# Patient Record
Sex: Female | Born: 1970 | Race: Black or African American | Hispanic: No | Smoking: Never smoker
Health system: Southern US, Community
[De-identification: ages and names within clinical notes are randomized; demographics above are authoritative.]

## PROBLEM LIST (undated history)

## (undated) DIAGNOSIS — M1990 Unspecified osteoarthritis, unspecified site: Secondary | ICD-10-CM

## (undated) HISTORY — DX: Unspecified osteoarthritis, unspecified site: M19.90

---

## 1999-09-28 ENCOUNTER — Ambulatory Visit (HOSPITAL_COMMUNITY): Admission: RE | Admit: 1999-09-28 | Discharge: 1999-09-28 | Payer: Self-pay | Admitting: Family Medicine

## 1999-09-28 ENCOUNTER — Encounter: Payer: Self-pay | Admitting: Family Medicine

## 2001-06-23 ENCOUNTER — Encounter: Admission: RE | Admit: 2001-06-23 | Discharge: 2001-06-23 | Payer: Self-pay | Admitting: General Practice

## 2001-06-23 ENCOUNTER — Encounter: Payer: Self-pay | Admitting: General Practice

## 2001-08-06 ENCOUNTER — Encounter: Admission: RE | Admit: 2001-08-06 | Discharge: 2001-08-06 | Payer: Self-pay | Admitting: General Practice

## 2001-08-06 ENCOUNTER — Encounter: Payer: Self-pay | Admitting: General Practice

## 2001-11-26 ENCOUNTER — Encounter: Payer: Self-pay | Admitting: Family Medicine

## 2001-11-26 ENCOUNTER — Ambulatory Visit (HOSPITAL_COMMUNITY): Admission: RE | Admit: 2001-11-26 | Discharge: 2001-11-26 | Payer: Self-pay | Admitting: Family Medicine

## 2002-01-22 ENCOUNTER — Other Ambulatory Visit: Admission: RE | Admit: 2002-01-22 | Discharge: 2002-01-22 | Payer: Self-pay | Admitting: Obstetrics and Gynecology

## 2002-02-25 ENCOUNTER — Other Ambulatory Visit: Admission: RE | Admit: 2002-02-25 | Discharge: 2002-02-25 | Payer: Self-pay | Admitting: Obstetrics and Gynecology

## 2002-08-07 ENCOUNTER — Emergency Department (HOSPITAL_COMMUNITY): Admission: EM | Admit: 2002-08-07 | Discharge: 2002-08-07 | Payer: Self-pay | Admitting: Emergency Medicine

## 2002-08-07 ENCOUNTER — Encounter: Payer: Self-pay | Admitting: Emergency Medicine

## 2002-09-05 ENCOUNTER — Inpatient Hospital Stay (HOSPITAL_COMMUNITY): Admission: AD | Admit: 2002-09-05 | Discharge: 2002-09-05 | Payer: Self-pay | Admitting: Obstetrics and Gynecology

## 2002-09-08 ENCOUNTER — Inpatient Hospital Stay (HOSPITAL_COMMUNITY): Admission: AD | Admit: 2002-09-08 | Discharge: 2002-09-08 | Payer: Self-pay | Admitting: Family Medicine

## 2007-01-29 ENCOUNTER — Ambulatory Visit (HOSPITAL_COMMUNITY): Admission: RE | Admit: 2007-01-29 | Discharge: 2007-01-29 | Payer: Self-pay | Admitting: Family Medicine

## 2009-06-29 ENCOUNTER — Ambulatory Visit (HOSPITAL_COMMUNITY): Admission: RE | Admit: 2009-06-29 | Discharge: 2009-06-29 | Payer: Self-pay | Admitting: Gastroenterology

## 2011-04-16 IMAGING — CT CT ABD-PELV W/ CM
2 of 4 series · 17 of 46 positions shown, 19 images · IV contrast (agent unspecified)
Comparison: None.

CLINICAL DATA: Left lower quadrant pain.  Mass felt on exam.

CT ABDOMEN AND PELVIS WITH CONTRAST
TECHNIQUE: Multidetector CT imaging of the abdomen and pelvis was
performed following the standard protocol during bolus
administration of intravenous contrast.
Contrast: 100 ml [id number]-H22.

[Series 2: rtn a/p with · axial · 0.62mm/px · z∈[-442,-82]mm · 14 of 81 slices shown, 16 images]
[im 6/81  soft-tissue]
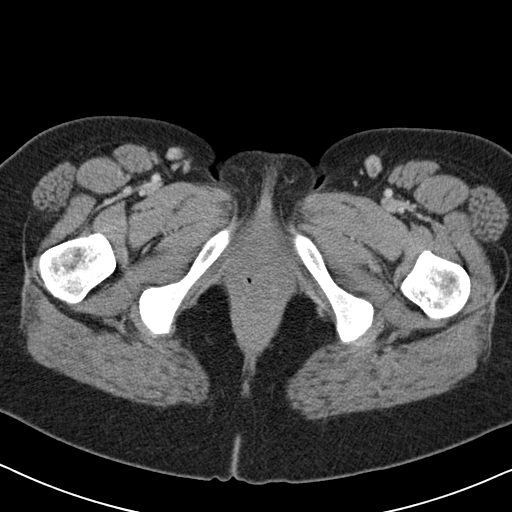
[im 6/81  bone]
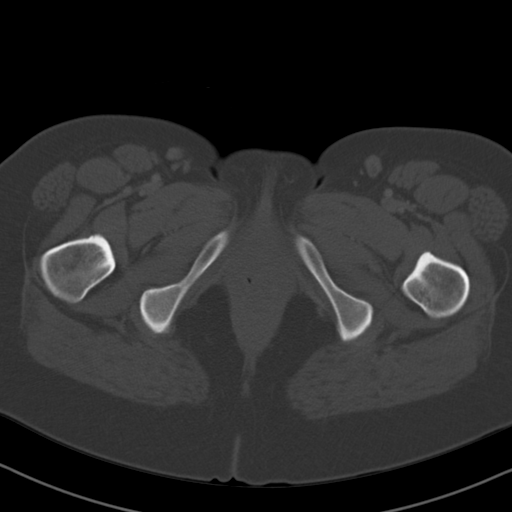
[im 12/81  soft-tissue]
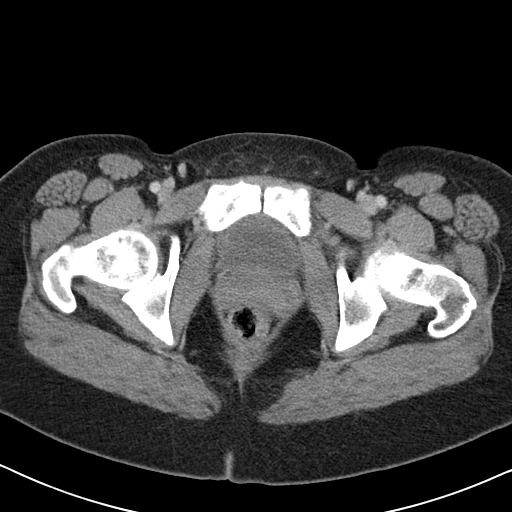
[im 17/81  soft-tissue]
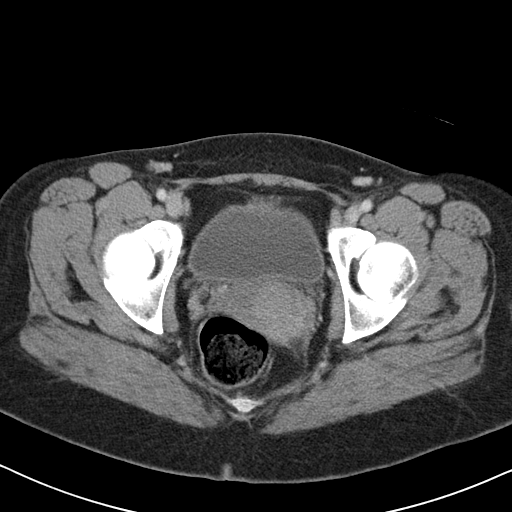
[im 23/81  soft-tissue]
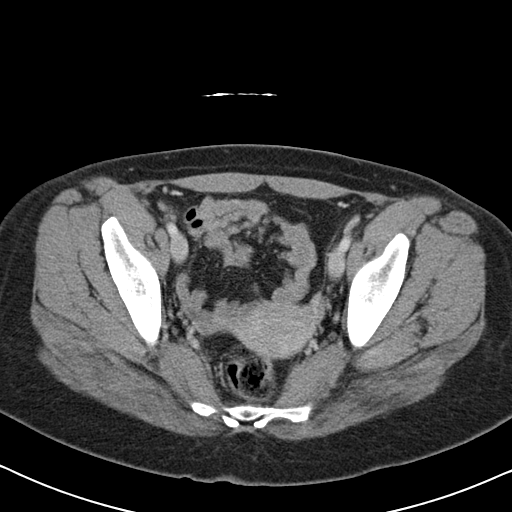
[im 28/81  soft-tissue]
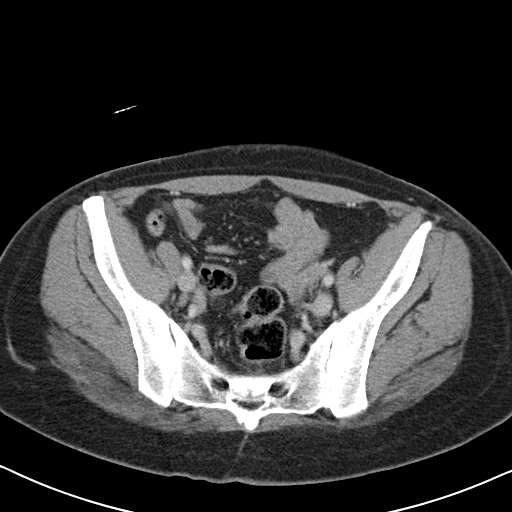
[im 34/81  soft-tissue]
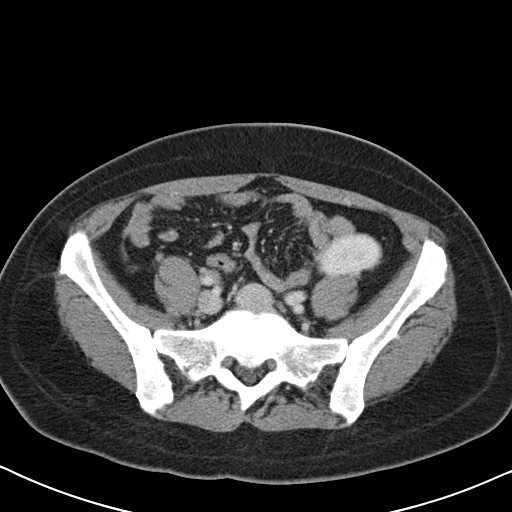
[im 39/81  soft-tissue]
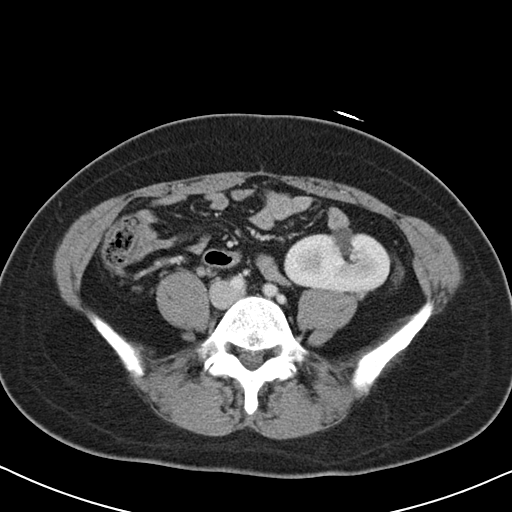
[im 45/81  soft-tissue]
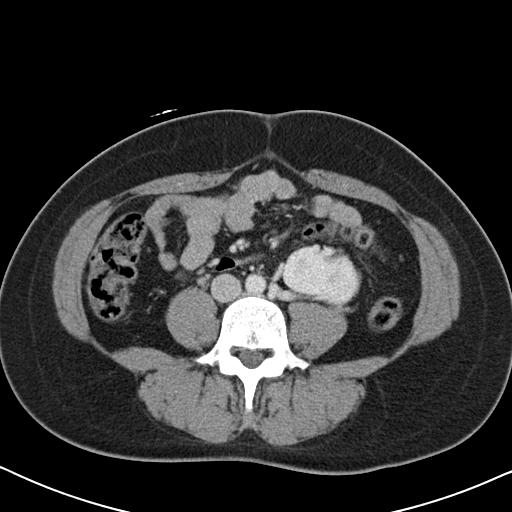
[im 50/81  soft-tissue]
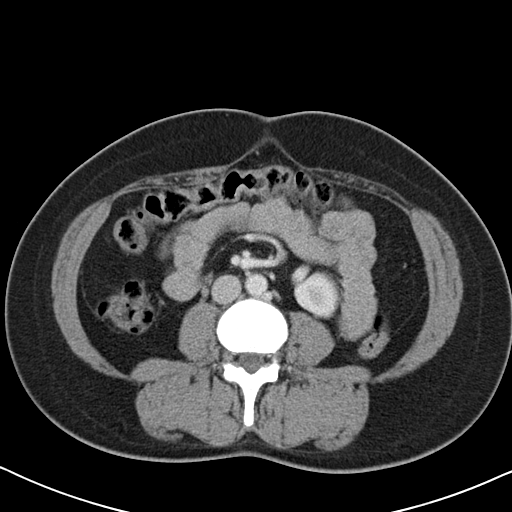
[im 50/81  bone]
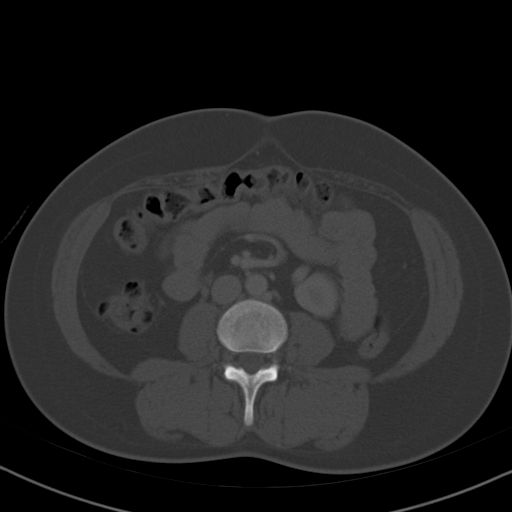
[im 56/81  soft-tissue]
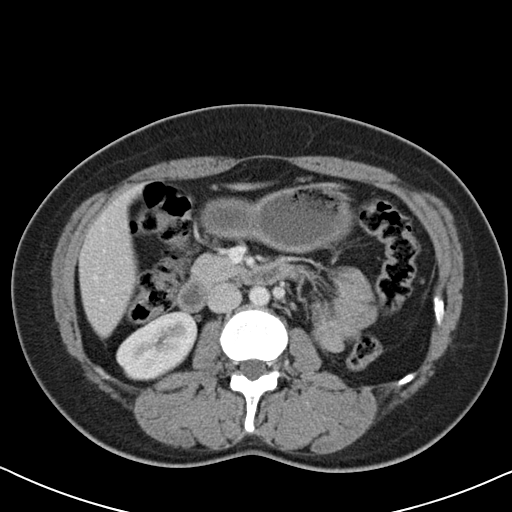
[im 61/81  soft-tissue]
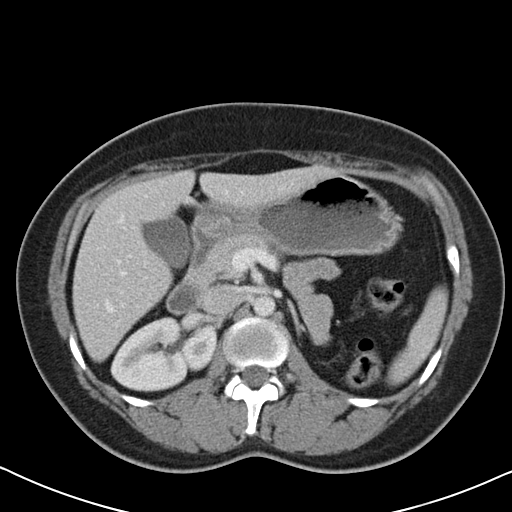
[im 67/81  soft-tissue]
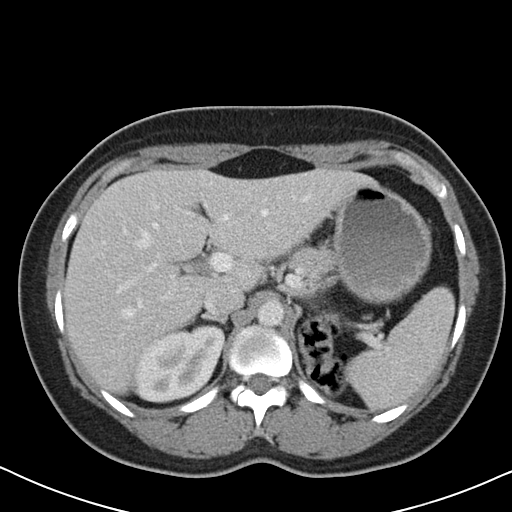
[im 72/81  soft-tissue]
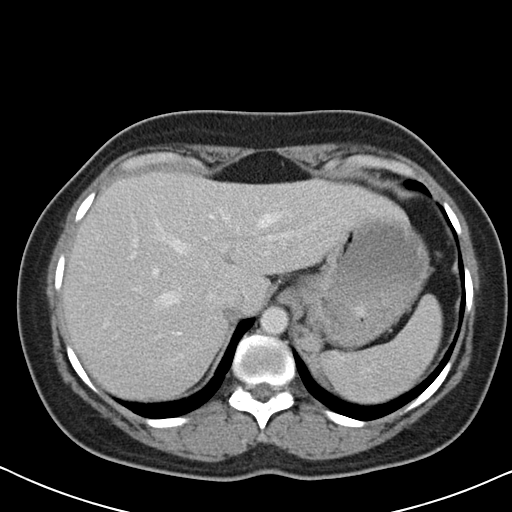
[im 78/81  soft-tissue]
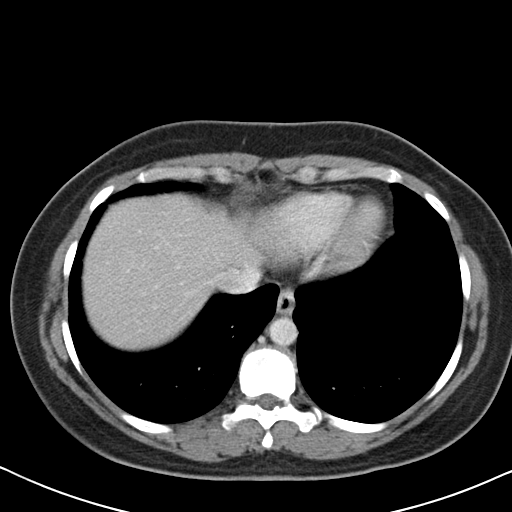

[Series 602: <mpr thick range> · coronal · 0.79mm/px · 3 of 71 slices shown]
[im 24/71  soft-tissue]
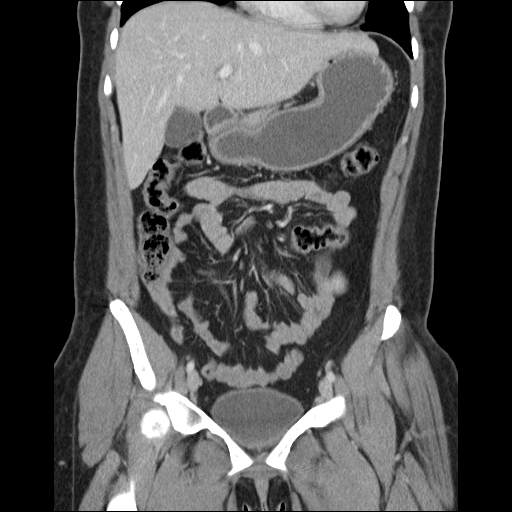
[im 32/71  soft-tissue]
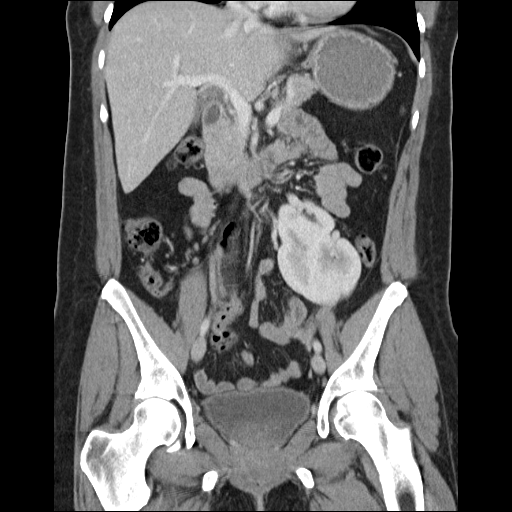
[im 39/71  soft-tissue]
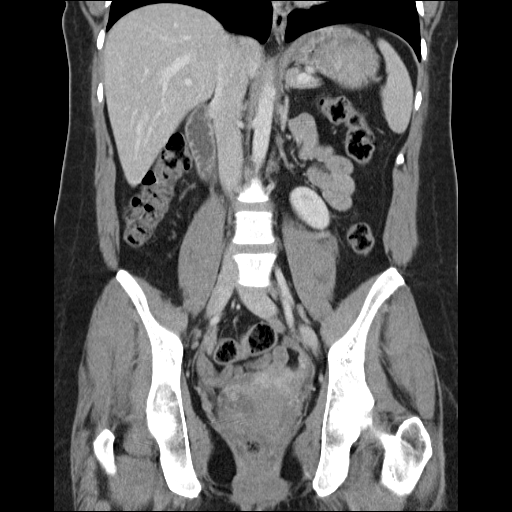

[17 of 46 positions shown; findings below may reference images not displayed]

FINDINGS: The left kidney is ptotic and displaced into the inferior
left abdomen.  It is possible that this is what was felt as a left-
sided mass.  No mass separate from this area is identified.  No
inflammatory process.

6.4 mm low density structure within the right kidney may be a cyst
although too small to adequately characterize otherwise no renal,
adrenal, hepatic, splenic or pancreatic mass noted.  No bony
destructive lesion.  No calcified gallstones.  No abdominal aortic
aneurysm.  Scattered normal sized lymph nodes without adenopathy.
IMPRESSION: Ptotic left kidney may represent left-sided palpable mass mass.

6.4 mm low density lesion within the right kidney too small to
adequately characterize possibly representing a small cyst.

## 2014-12-15 ENCOUNTER — Emergency Department (HOSPITAL_BASED_OUTPATIENT_CLINIC_OR_DEPARTMENT_OTHER)
Admission: EM | Admit: 2014-12-15 | Discharge: 2014-12-15 | Disposition: A | Discharge status: Home / Self Care | Payer: 59 | Attending: Emergency Medicine | Admitting: Emergency Medicine

## 2014-12-15 ENCOUNTER — Encounter (HOSPITAL_BASED_OUTPATIENT_CLINIC_OR_DEPARTMENT_OTHER): Payer: Self-pay | Admitting: *Deleted

## 2014-12-15 DIAGNOSIS — M546 Pain in thoracic spine: Secondary | ICD-10-CM | POA: Diagnosis present

## 2014-12-15 MED ORDER — IBUPROFEN 400 MG PO TABS: 400.0000 mg | ORAL_TABLET | Freq: Four times a day (QID) | ORAL | Status: AC

## 2014-12-15 MED ORDER — METHOCARBAMOL 500 MG PO TABS: 500.0000 mg | ORAL_TABLET | Freq: Two times a day (BID) | ORAL | Status: DC

## 2014-12-15 MED ORDER — KETOROLAC TROMETHAMINE 60 MG/2ML IM SOLN
60.0000 mg | Freq: Once | INTRAMUSCULAR | Status: AC
  Administered 2014-12-15: 60 mg via INTRAMUSCULAR
  Filled 2014-12-15: qty 2

## 2014-12-15 MED ORDER — DIAZEPAM 5 MG PO TABS
5.0000 mg | ORAL_TABLET | Freq: Once | ORAL | Status: AC
  Administered 2014-12-15: 5 mg via ORAL
  Filled 2014-12-15: qty 1

## 2014-12-15 NOTE — ED Provider Notes (Signed)
CSN: [id number]     Arrival date & time 12/15/14  4098 History   First MD Initiated Contact with Patient 12/15/14 [phone]     Chief Complaint  Patient presents with  . Back Pain     (Consider location/radiation/quality/duration/timing/severity/associated sxs/prior Treatment) Patient is a 44 y.o. female presenting with back pain.  Back Pain Location:  Thoracic spine Quality:  Aching and stiffness Radiates to:  Does not radiate Pain severity:  Mild Onset quality:  Gradual Duration:  5 days Timing:  Constant Chronicity:  New Context: lifting heavy objects   Ineffective treatments: chiropractor. Associated symptoms: no abdominal pain, no dysuria, no fever, no headaches, no leg pain, no numbness and no weakness     History reviewed. No pertinent past medical history. History reviewed. No pertinent past surgical history. No family history on file. Social History  Substance Use Topics  . Smoking status: Never Smoker   . Smokeless tobacco: None  . Alcohol Use: No   OB History    No data available     Review of Systems  Constitutional: Negative for fever.  HENT: Negative for ear pain.   Respiratory: Negative for cough and shortness of breath.   Gastrointestinal: Negative for abdominal pain.  Genitourinary: Negative for dysuria.  Musculoskeletal: Positive for back pain.  Neurological: Negative for weakness, light-headedness, numbness and headaches.  All other systems reviewed and are negative.     Allergies  Review of patient's allergies indicates no known allergies.  Home Medications   Prior to Admission medications   Medication Sig Start Date End Date Taking? Authorizing Herlinda Heady  ibuprofen (ADVIL,MOTRIN) 400 MG tablet Take 1 tablet (400 mg total) by mouth every 6 (six) hours. 12/15/14 12/19/14  Marily Memos, MD  methocarbamol (ROBAXIN) 500 MG tablet Take 1 tablet (500 mg total) by mouth 2 (two) times daily. 12/15/14   Barbara Cower Mesner, MD   BP 145/77 mmHg  Pulse 74   Temp(Src) 98.3 F (36.8 C) (Oral)  Resp 16  Ht  (1.702 m)  Wt 170 lb (77.111 kg)  BMI 26.62 kg/m2  SpO2 100% Physical Exam  Constitutional: She is oriented to person, place, and time. She appears well-developed and well-nourished.  HENT:  Head: Normocephalic and atraumatic.  Eyes: Conjunctivae and EOM are normal. Right eye exhibits no discharge. Left eye exhibits no discharge.  Cardiovascular: Normal rate and regular rhythm.   Pulmonary/Chest: Effort normal and breath sounds normal. No respiratory distress.  Abdominal: Soft. She exhibits no distension. There is no tenderness. There is no rebound.  Musculoskeletal: Normal range of motion. She exhibits tenderness (paraspinal muscles around thoracic spine, worse with flexion of muscles). She exhibits no edema.  Neurological: She is alert and oriented to person, place, and time.  Skin: Skin is warm and dry.  Nursing note and vitals reviewed.   ED Course  Procedures (including critical care time) Labs Review Labs Reviewed - No data to display  Imaging Review No results found. I have personally reviewed and evaluated these images and lab results as part of my medical decision-making.   EKG Interpretation None      MDM   Final diagnoses:  Bilateral thoracic back pain   Back strain, feels similar to prior. Will tx symptomatically. Recent travel to [place] but No shortness of breath or cough to suggest pulmonary causes. Doubt cardiac related. No fevers or systemic symptoms suggest infectious causes. DC home with symptomatically treatment have her follow-up with the primary care doctor. Return here for new or worsening  symptoms.  I have personally and contemperaneously reviewed labs and imaging and used in my decision making as above.   A medical screening exam was performed and I feel the patient has had an appropriate workup for their chief complaint at this time and likelihood of emergent condition existing is low. They have  been counseled on decision, discharge, follow up and which symptoms necessitate immediate return to the emergency department. They or their family verbally stated understanding and agreement with plan and discharged in stable condition.      Marily Memos, MD 12/15/14 [phone]

## 2014-12-15 NOTE — ED Notes (Signed)
Pt c/o middle back pain x 2 weeks that has gotten progressivly worse. Pt sts the pain gets worse at night when she is lying down. Pt sts she does heavy lifting on a regular basis.

## 2014-12-15 NOTE — Discharge Instructions (Signed)
°Emergency Department Resource Guide °1) Find a Doctor and Pay Out of Pocket °Although you won't have to find out who is covered by your insurance plan, it is a good idea to ask around and get recommendations. You will then need to call the office and see if the doctor you have chosen will accept you as a new patient and what types of options they offer for patients who are self-pay. Some doctors offer discounts or will set up payment plans for their patients who do not have insurance, but you will need to ask so you aren't surprised when you get to your appointment. ° °2) Contact Your Local Health Department °Not all health departments have doctors that can see patients for sick visits, but many do, so it is worth a call to see if yours does. If you don't know where your local health department is, you can check in your phone book. The CDC also has a tool to help you locate your state's health department, and many state websites also have listings of all of their local health departments. ° °3) Find a Walk-in Clinic °If your illness is not likely to be very severe or complicated, you may want to try a walk in clinic. These are popping up all over the country in pharmacies, drugstores, and shopping centers. They're usually staffed by nurse practitioners or physician assistants that have been trained to treat common illnesses and complaints. They're usually fairly quick and inexpensive. However, if you have serious medical issues or chronic medical problems, these are probably not your best option. ° °No Primary Care Doctor: °- Call [facility] at  832-8000 - they can help you locate a primary care doctor that  accepts your insurance, provides certain services, etc. °- Physician Referral Service- 1-800-533-3463 ° °Chronic Pain Problems: °Organization         Address  Phone   Notes  °[facility]  (336) 297-2271 Patients need to be referred by their primary care doctor.  ° °Medication  Assistance: °Organization         Address  Phone   Notes  °[place] [facility] [address] °[place], [place] [address] (336) 641-8030 --Must be a resident of [place] °-- Must have NO insurance coverage whatsoever (no Medicaid/ Medicare, etc.) °-- The pt. MUST have a primary care doctor that directs their care regularly and follows them in the community °  °[facility]  (866) 331-1348   °[employer]  (888) 892-1162   ° °Agencies that provide inexpensive medical care: °Organization         Address  Phone   Notes  °[facility]  (336) 832-8035   °[facility]    (336) 832-7272   °[facility] [address] °[place], [place] [address] (336) 832-4777   °[facility] [address], °[place] (336) 271-4999   °[facility]    (336) 373-0678   °[facility]    (336) 272-1050   °[facility] ° [address], [place] Phone:  (336) 832-4444, Fax:  (336) 832-4440 Hours of Operation:  9 am - 6 pm, M-F.  Also accepts Medicaid/Medicare and self-pay.  °[facility] ° [address], [place] Phone: (336) 832-3150, Fax: (336) [phone]. Hours of Operation:  8:30 am - 5:30 pm, M-F.  Also accepts Medicaid and self-pay.  °[facility] [place] [address], [place] Phone: (336) 878-6027   °[facility] [address], [place], [place] (336)723-1848, Ext. 123 Mondays & Thursdays: 7-9 AM.  First 15 patients are seen on a first come, first serve basis. °  ° °Medicaid-accepting [place] Providers: ° °Organization         Address  Phone   Notes  °[facility] [address], [place] (336) 641-2100 Also accepts self-pay patients.  °[facility] [address], [place] ° (336) 856-9996   °[facility] [address], [place]  (336) 288-8857   °[facility] [address] [place] [address], [place] (336) 299-7000   °Veita Bland [address], [place]  ° (336) 373-1557 Only accepts [place] Access Medicaid patients after they have their name applied to their card.  ° °Self-Pay (no insurance) in [place]: ° °Organization         Address  Phone   Notes  °Sickle Cell Patients, [facility] [address], [place] (336) 832-1970   °[facility] [address], [place] (336) 832-4400   °[facility] ° [address], [place] (336) 992-4800   °[facility]/Dr. Osei-Bonsu ° [address] [place] [address], [place] or [address], [place] (336) 841-8500 Phone number for both [place] and [place] locations is the same.  °[facility] [address], [place] (336) 299-0000   °[facility] [address] [place] [address], [place] or [address] (336) 852-7530 °(336) 878-2260   °[facility] [address], [place] (336) 350-1642, phone; (336) 294-5005, fax Sees patients 1st and 3rd Saturday of every month.  Must not qualify for public or private insurance (i.e. Medicaid, Medicare, [place] Health Choice, Veterans' Benefits) • Household income should be no more than 200% of the poverty level •The clinic cannot treat you if you are pregnant or think you are pregnant • Sexually transmitted diseases are not treated at the clinic.  ° ° °Dental Care: °Organization         Address  Phone  Notes  °[place] [facility] [address], [place] (336) 641-6152 Accepts children up to age 21 who are enrolled in Medicaid or [place] Health Choice; pregnant women with a Medicaid card; and children who have applied for Medicaid or [place] Health Choice, but were declined, whose parents can pay a reduced fee at time of service.  °[place]  [facility] [place]  [address], [place] (336) 641-7733 Accepts children up to age 21 who are enrolled in Medicaid or [place] Health Choice; pregnant women with a Medicaid card; and children who have applied for Medicaid or  Health Choice, but were declined, whose parents can pay a reduced fee at time of service.  °[facility] ° [address],  (336) 641-4533 Patients are seen by appointment only. Walk-ins are not accepted. [facility] will see patients 18 years of age and older. °Monday - Tuesday (8am-5pm) °Most Wednesdays (8:30-5pm) °$30 per visit, cash only  °[facility] ° [address], [place] (336) 641-4533 Patients are seen by appointment only. Walk-ins are not accepted. [facility] will see patients 18 years of age and older. °One   Wednesday Evening (Monthly: Volunteer Based).  $30 per visit, cash only  °[facility]  (919) 537-3737 for adults; Children under age 4, call Graduate Pediatric Dentistry at (919) [phone]. Children aged 4-14, please call (919) [phone] to request a pediatric application. ° Dental services are provided in all areas of dental care including fillings, crowns and bridges, complete and partial dentures, implants, gum treatment, root canals, and extractions. Preventive care is also provided. Treatment is provided to both adults and children. °Patients are selected via a lottery and there is often a waiting list. °  °[facility] [address], °[place] ° (336) 763-8833 [email or web address] °  °[facility] [address], [place], [place] (336)723-1848, Ext. 123 Second and Fourth Thursday of each month, opens at 6:30 AM; Clinic ends at 9 AM.  Patients are seen on a first-come first-served basis, and a limited number are seen during each clinic.  ° °[facility] ° [address], [place], [place] (336) 723-7904    Eligibility Requirements °You must have lived in [place], [place], or [place] counties for at least the last three months. °  You cannot be eligible for state or federal sponsored healthcare insurance, including Veterans Administration, Medicaid, or Medicare. °  You generally cannot be eligible for healthcare insurance through your employer.  °  How to apply: °Eligibility screenings are held every Tuesday and Wednesday afternoon from 1:00 pm until 4:00 pm. You do not need an appointment for the interview!  °[facility] [address], [place], [place] 336-631-2330   °[facility]  336-342-8273   °[facility]  336-703-3100   °[facility]  336-570-6415   ° °Behavioral Health Resources in the Community: °Intensive Outpatient Programs °Organization         Address  Phone  Notes  °[facility] [address], [place], [place] 336-878-6098   °[facility] [address], [place], [place] 336-832-9800   °[facility] [address], [place], [place] ° 336-882-2125   °[facility] [address],  °[place], [place] 1-800-853-5163 or 336-641-4981   °Substance Abuse Resources °Organization         Address  Phone  Notes  °[facility]  336-882-2125   °[facility]  336-784-9470   °[facility]  336-285-9073   °[facility]  336-845-3988   °Residential & Outpatient Substance Abuse Program  1-800-659-3381   °Psychological Services °Organization         Address  Phone  Notes  °[facility]  336- 832-9600   °[facility]  336- 378-7881   °[facility] [address], [place] 1-800-853-5163 or 336-641-4981   ° °Mobile Crisis Teams °Organization         Address  Phone  Notes  °[facility], Mobile Crisis Care Unit  1-877-626-1772   °Assertive °[facility] ° [address]  [place], [place] 336-834-9664   °Sharon DeEsch [address] °[place] 336-554-5454   ° °Self-Help/Support Groups °Organization         Address  Phone             Notes  °[facility] - variety of support groups  336- 373-1402 Call for more information  °Narcotics Anonymous (NA), [facility] [address], °[place]   2 meetings at this location  ° °  Residential Treatment Programs °Organization         Address  Phone  Notes  °[facility] [address],    °[place]  1-866-801-8205   °[facility] ° [address], [place], [place] 704-293-8524   °[facility] [address], [place] 336-845-3988 Admissions: 8am-3pm M-F  °[facility] [address]    °[place], [place] 336-841-1104   °[facility] [address], [place], [place] 336-379-7146   °[facility] [address]  °[place], [place] 336-285-9073   °[facility] - Intensive Outpatient [address], [place], [place] 336-852-3033   °[facility] [address]  °[place], [place] 1-877-615-2722 or 336-784-9470   °[facility] [address] [place], [place] 336-227-7417 Accepts Medicaid  °[facility] [address]  °[place] 1-800-659-3381 Substance Abuse/Addiction Treatment  ° °[place] Behavioral Health Resources °Organization         Address  Phone  Notes  °[facility]  (888) 581-9988   °Julie Brannon, PhD [address] [place], [place]   (336) 349-5553 or (336) 951-0000   °[facility]   [address] °[place], [place] (336) 349-4454   °[facility] [address], [place], [place] (336) 342-8316 Insurance/Medicaid/sponsorship through [facility]  °[facility] [address]                                    [place], [place] (336) 342-8316 Therapy/tele-psych/case    °[facility] [address]  ° [place], [place] (336) 349-2233    °Dr. Arfeen  (336) 349-4544   °[facility] [place]  [employer] [place] [facility] 1) [address], [place] °2) [address], [place] °3)  [address], [place] (336) 349-3220 °(336) 342-7768 ° °(336) 342-8140   °[place] [facility] (336) 342-1394 or (336) 342-3537 (After Hours)    ° ° °

## 2014-12-15 NOTE — ED Notes (Signed)
Pt states that her sister is on the way to pick her up. Will notify this rn when her sister arrives.

## 2015-12-06 ENCOUNTER — Ambulatory Visit (INDEPENDENT_AMBULATORY_CARE_PROVIDER_SITE_OTHER): Payer: Self-pay | Admitting: Physician Assistant

## 2015-12-06 VITALS — BP 138/76 | HR 59 | Temp 98.2°F | Resp 16 | Ht 67.0 in | Wt 181.0 lb

## 2015-12-06 DIAGNOSIS — R9431 Abnormal electrocardiogram [ECG] [EKG]: Secondary | ICD-10-CM

## 2015-12-06 DIAGNOSIS — E041 Nontoxic single thyroid nodule: Secondary | ICD-10-CM

## 2015-12-06 DIAGNOSIS — R0789 Other chest pain: Secondary | ICD-10-CM

## 2015-12-06 MED ORDER — RANITIDINE HCL 150 MG PO TABS: 150.0000 mg | ORAL_TABLET | Freq: Two times a day (BID) | ORAL | 1 refills | Status: DC

## 2015-12-06 NOTE — Progress Notes (Signed)
Kathy RoupKhadidiatou Giuliano  MRN: [id number] DOB: 03/21/1971  Subjective:  Kathy Porter is a 45 y.o. female seen in office today for a chief complaint of chest discomfort and lump in throat.   1)Chest discomfort: Pt complains of intermittent burning in anterior chest and back x 4 days. She has associated acidic taste in mouth, acidic regurgitation, and belching, more commonly at night time. She denies SOB, nausea, vomiting, abdominal pain, diaphoresis, radiating symptoms, and exertional chest pain. Chest discomfort is not present today. She loves to eat spicy foods like hot peppers and vinegar and notes that these foods tend to make the chest pain worse. She eats at 9pm on most nights and lies down for bed around 10 pm. The pain typically presents after she has been lying down for a little bit. When the pain appears, she drinks milk with minimal relief.  2) Throat lump: Pt noticed a lump on her neck about 2 months incidentally. Since then, she has constantly been palpating it to make sure it has not changed in size. She denies pain, change in size, and difficulty swallowing. Has not tried anything to make it better.   Review of Systems  Constitutional: Negative for appetite change, chills, fatigue, fever and unexpected weight change.  HENT: Negative for voice change.   Respiratory: Negative for cough and shortness of breath.   Cardiovascular: Negative for palpitations and leg swelling.  Gastrointestinal: Negative for abdominal pain, constipation, diarrhea, nausea and vomiting.  Endocrine: Negative for cold intolerance and heat intolerance.  Musculoskeletal: Negative for myalgias.  Neurological: Negative for dizziness, tremors, weakness, light-headedness, numbness and headaches.    There are no active problems to display for this patient.   Current Outpatient Prescriptions on File Prior to Visit  Medication Sig Dispense Refill  . methocarbamol (ROBAXIN) 500 MG tablet Take 1 tablet (500 mg  total) by mouth 2 (two) times daily. 20 tablet 0   No current facility-administered medications on file prior to visit.    History reviewed. No pertinent past medical history.   No family history of heart disease or thyroid disease.  No Known Allergies   Social History   Social History  . Marital status: Single    Spouse name: N/A  . Number of children: N/A  . Years of education: N/A   Occupational History  . Not on file.   Social History Main Topics  . Smoking status: Never Smoker  . Smokeless tobacco: Never Used  . Alcohol use No  . Drug use: No  . Sexual activity: Not on file   Other Topics Concern  . Not on file   Social History Narrative  . No narrative on file    Objective:  BP 138/76   Pulse (!) 59   Temp 98.2 F (36.8 C) (Oral)   Resp 16   Ht 5\' 7"  (1.702 m)   Wt 181 lb (82.1 kg)   LMP  (LMP Unknown)   SpO2 99%   BMI 28.35 kg/m   Physical Exam  Constitutional: She is oriented to person, place, and time and well-developed, well-nourished, and in no distress.  HENT:  Head: Normocephalic and atraumatic.  Nose: Nose normal.  Eyes: Conjunctivae are normal.  Neck: Normal range of motion. Thyroid mass (palpable 1cm nontender, mobile mass on left thyroid, best palpated when pt turns her head to look over right shoulder ) present.  Cardiovascular: Normal rate, regular rhythm and normal heart sounds.   Pulmonary/Chest: Effort normal.  Musculoskeletal:  Right lower leg: She exhibits no swelling.       Left lower leg: She exhibits no swelling.  Lymphadenopathy:       Head (right side): No submental, no submandibular, no tonsillar, no preauricular, no posterior auricular and no occipital adenopathy present.       Head (left side): No submental, no submandibular, no tonsillar, no preauricular, no posterior auricular and no occipital adenopathy present.    She has no cervical adenopathy.       Right: No supraclavicular adenopathy present.       Left: No  supraclavicular adenopathy present.  Neurological: She is alert and oriented to person, place, and time. Gait normal.  Reflex Scores:      Tricep reflexes are 2+ on the right side and 2+ on the left side.      Bicep reflexes are 2+ on the right side and 2+ on the left side.      Brachioradialis reflexes are 2+ on the right side and 2+ on the left side.      Patellar reflexes are 2+ on the right side and 2+ on the left side.      Achilles reflexes are 2+ on the right side and 2+ on the left side. Skin: Skin is warm and dry.  Psychiatric: Affect normal.  Vitals reviewed.   No results found for this or any previous visit (from the past 24 hour(s)).  EKG shows nonspecific ST changes and T wave inversion. No prior EKG to compare to. EKG presented to Dr. Nilda Simmer, who agrees a cardiology consult is warranted.   Assessment and Plan :  1. Chest discomfort -History consistent with GERD; discussed lifestyle and diet modifications, given educational material on GERD - EKG 12-Lead - ranitidine (ZANTAC) 150 MG tablet; Take 1 tablet (150 mg total) by mouth 2 (two) times daily.  Dispense: 60 tablet; Refill: 1 -Follow up in four weeks if no improvement -Seek care immediately if develop new concerning symptoms disccused or current symptoms worsen  2. Abnormal finding on EKG -Pt has been told by physicians in the past that she has an abnormal EKG; however, she cannot recall what they said was abnormal about it and she does not have any of her previous records. Due to abnormal EKG findings, will refer to cardiology. - Ambulatory referral to Cardiology  3. Thyroid nodule -Await results - US THYROID; Future - TSH  Benjiman Core PA-C  [facility] 12/06/2015 10:12 AM

## 2015-12-06 NOTE — Patient Instructions (Addendum)
Take zantac twice a day for heart burn.  Try lifestyle and diet modifications we discussed.  If you do not have any improvement in four weeks, follow up.  Follow up with thyroid US Follow up with cardiology  Come back in a couple of weeks for annual physical exam.     IF you received an x-ray today, you will receive an invoice from [facility]. Please contact [facility] at [phone] with questions or concerns regarding your invoice.   IF you received labwork today, you will receive an invoice from [facility]. Please contact [facility] at [phone] with questions or concerns regarding your invoice.   Our billing staff will not be able to assist you with questions regarding bills from these companies.  You will be contacted with the lab results as soon as they are available. The fastest way to get your results is to activate your My Chart account. Instructions are located on the last page of this paperwork. If you have not heard from us regarding the results in 2 weeks, please contact this office.     Food Choices for Gastroesophageal Reflux Disease, Adult When you have gastroesophageal reflux disease (GERD), the foods you eat and your eating habits are very important. Choosing the right foods can help ease the discomfort of GERD. WHAT GENERAL GUIDELINES DO I NEED TO FOLLOW?  Choose fruits, vegetables, whole grains, low-fat dairy products, and low-fat meat, fish, and poultry.  Limit fats such as oils, salad dressings, butter, nuts, and avocado.  Keep a food diary to identify foods that cause symptoms.  Avoid foods that cause reflux. These may be different for different people.  Eat frequent small meals instead of three large meals each day.  Eat your meals slowly, in a relaxed setting.  Limit fried foods.  Cook foods using methods other than frying.  Avoid drinking alcohol.  Avoid drinking large amounts of liquids with your  meals.  Avoid bending over or lying down until 2-3 hours after eating. WHAT FOODS ARE NOT RECOMMENDED? The following are some foods and drinks that may worsen your symptoms: Vegetables Tomatoes. Tomato juice. Tomato and spaghetti sauce. Chili peppers. Onion and garlic. Horseradish. Fruits Oranges, grapefruit, and lemon (fruit and juice). Meats High-fat meats, fish, and poultry. This includes hot dogs, ribs, ham, sausage, salami, and bacon. Dairy Whole milk and chocolate milk. Sour cream. Cream. Butter. Ice cream. Cream cheese.  Beverages Coffee and tea, with or without caffeine. Carbonated beverages or energy drinks. Condiments Hot sauce. Barbecue sauce.  Sweets/Desserts Chocolate and cocoa. Donuts. Peppermint and spearmint. Fats and Oils High-fat foods, including [place] fries and potato chips. Other Vinegar. Strong spices, such as black pepper, white pepper, red pepper, cayenne, curry powder, cloves, ginger, and chili powder. The items listed above may not be a complete list of foods and beverages to avoid. Contact your dietitian for more information.   This information is not intended to replace advice given to you by your health care provider. Make sure you discuss any questions you have with your health care provider.   Document Released: 03/12/2005 Document Revised: 04/02/2014 Document Reviewed: 01/14/2013 Elsevier Interactive Patient Education [employer]2016 [employer]vier Inc.

## 2015-12-07 LAB — TSH: TSH: 1.49 mIU/L

## 2015-12-20 ENCOUNTER — Ambulatory Visit
Admission: RE | Admit: 2015-12-20 | Discharge: 2015-12-20 | Disposition: A | Discharge status: Home / Self Care | Payer: No Typology Code available for payment source | Source: Ambulatory Visit | Attending: Physician Assistant | Admitting: Physician Assistant

## 2015-12-20 DIAGNOSIS — E041 Nontoxic single thyroid nodule: Secondary | ICD-10-CM

## 2015-12-22 ENCOUNTER — Telehealth: Payer: Self-pay | Admitting: Physician Assistant

## 2015-12-22 NOTE — Telephone Encounter (Signed)
Encounter opened in error

## 2015-12-22 NOTE — Telephone Encounter (Signed)
Pt contacted about thyroid US results. Informed that it is not recommnended that she have the nodule biopsied or surveillanced. However, if it changes size or shape or starts to cause pain, please let me know. Pt also questions continuing dry cough. She has no other symptoms and her chest discomfort has been well controlled on zantac that was initiated 12/06/15. I have informed her to continue zantac and start on zyrtec and flonase. She is to follow up in two weeks if the cough is continuing despite this treatment. If symptoms worsen, she is to seek care sooner. Pt understands and agrees to treatment plan.

## 2016-01-16 ENCOUNTER — Other Ambulatory Visit: Payer: Self-pay | Admitting: Physician Assistant

## 2016-01-16 DIAGNOSIS — R0789 Other chest pain: Secondary | ICD-10-CM

## 2016-06-20 ENCOUNTER — Ambulatory Visit (INDEPENDENT_AMBULATORY_CARE_PROVIDER_SITE_OTHER): Payer: Self-pay | Admitting: Physician Assistant

## 2016-06-20 VITALS — BP 127/80 | HR 83 | Temp 98.1°F | Resp 17 | Ht 68.0 in | Wt 186.0 lb

## 2016-06-20 DIAGNOSIS — Z0000 Encounter for general adult medical examination without abnormal findings: Secondary | ICD-10-CM

## 2016-06-20 DIAGNOSIS — Z13228 Encounter for screening for other metabolic disorders: Secondary | ICD-10-CM

## 2016-06-20 DIAGNOSIS — J029 Acute pharyngitis, unspecified: Secondary | ICD-10-CM

## 2016-06-20 DIAGNOSIS — Z13220 Encounter for screening for lipoid disorders: Secondary | ICD-10-CM

## 2016-06-20 DIAGNOSIS — Z124 Encounter for screening for malignant neoplasm of cervix: Secondary | ICD-10-CM

## 2016-06-20 LAB — POCT CBC
Granulocyte percent: 42.2 %G (ref 37–80)
HEMATOCRIT: 39.1 % (ref 37.7–47.9)
HEMOGLOBIN: 13.5 g/dL (ref 12.2–16.2)
LYMPH, POC: 2.1 (ref 0.6–3.4)
MCH: 30.2 pg (ref 27–31.2)
MCHC: 34.5 g/dL (ref 31.8–35.4)
MCV: 87.6 fL (ref 80–97)
MID (cbc): 0.3 (ref 0–0.9)
MPV: 7.3 fL (ref 0–99.8)
POC GRANULOCYTE: 1.7 — AB (ref 2–6.9)
POC LYMPH PERCENT: 51.3 %L — AB (ref 10–50)
POC MID %: 6.5 % (ref 0–12)
Platelet Count, POC: 264 10*3/uL (ref 142–424)
RBC: 4.46 M/uL (ref 4.04–5.48)
RDW, POC: 12.5 %
WBC: 4 10*3/uL — AB (ref 4.6–10.2)

## 2016-06-20 LAB — POCT RAPID STREP A (OFFICE): RAPID STREP A SCREEN: NEGATIVE

## 2016-06-20 NOTE — Patient Instructions (Addendum)
Your strep test was negative. I have sent a culture and will contact you with your results. Your blood work from our office indicates you may be having a viral infection, which could be responsible for your sore throat. Also, there is an abrasion at the roof of your mouth, which could also be irritating your throat. Make sure you eat smoothies or soft foods until that abrasion has healed. For sore throat, you can use OTC throat lozenges and do salt water gargles. If it worsens or does not improve over 5-7 days, please return.  In terms of your pap smear and other blood work, our office will contact you in a few days with your results.  In terms of your health, make sure you try to increase you vegetable and fruit consumption. Also start trying to increase you exercise to at least ten minutes of walking a day.   Thank you for letting me participate in your health and well being.     IF you received an x-ray today, you will receive an invoice from [facility]. Please contact [facility] at [phone] with questions or concerns regarding your invoice.   IF you received labwork today, you will receive an invoice from [facility]. Please contact [facility] at [phone] with questions or concerns regarding your invoice.   Our billing staff will not be able to assist you with questions regarding bills from these companies.  You will be contacted with the lab results as soon as they are available. The fastest way to get your results is to activate your My Chart account. Instructions are located on the last page of this paperwork. If you have not heard from us regarding the results in 2 weeks, please contact this office.

## 2016-06-20 NOTE — Progress Notes (Signed)
Kathy Porter  MRN: [id number] DOB: Oct 13, 1970  Subjective:  Pt is a 46 y.o. female who presents for annual physical exam  Social: She has 3 daughters. Her husband passed away 2 years ago in [place] from liver cancer. She moved here in 1998. She works as a Probation officer.  Diet: Rice, chicken, small amount of fruits and vegetables. Drinks sweet tea and water. Some soda.  Exercise: No structured exercise.   Menstrual status: Postmenopausal.   Bowel movements: Has one every 2 days, normal for her, no excessive straining and it is normal consistency.  Sleep:  Gets a good 8 hours of sleep a night.   Last dental exam: 2013 Last vision exam: 2017 Last pap smear: 2 years, abnormal but she cannot remember what it said.  Last mammogram: Never Last colonoscopy: Never Vaccinations      Tetanus: 2013        She is also having sore throat x 2 days. Has associated headache. Denies difficulty swallowing, fever, chills, sinus pain, ear pain, runny nose, or cough. Has not been exposed to anyone who has been sick. Denies smoking.   There are no active problems to display for this patient.   No current outpatient prescriptions on file prior to visit.   No current facility-administered medications on file prior to visit.     No Known Allergies  Social History   Social History  . Marital status: Single    Spouse name: N/A  . Number of children: N/A  . Years of education: N/A   Social History Main Topics  . Smoking status: Never Smoker  . Smokeless tobacco: Never Used  . Alcohol use No  . Drug use: No  . Sexual activity: No   Other Topics Concern  . None   Social History Narrative  . None    No past surgical history on file.  Family History  Problem Relation Age of Onset  . Stroke Father     Review of Systems  Objective:  BP 127/80   Pulse 83   Temp 98.1 F (36.7 C) (Oral)   Resp 17   Ht _0  (1.727 m)   Wt 186 lb (84.4 kg)   LMP  (LMP Unknown)   SpO2 98%    BMI 28.28 kg/m   Physical Exam  Constitutional: She is oriented to person, place, and time and well-developed, well-nourished, and in no distress.  HENT:  Head: Normocephalic and atraumatic.  Right Ear: Hearing, tympanic membrane, external ear and ear canal normal.  Left Ear: Hearing, tympanic membrane, external ear and ear canal normal.  Nose: Nose normal.  Mouth/Throat: Uvula is midline and mucous membranes are normal. Posterior oropharyngeal erythema present. No oropharyngeal exudate.    Eyes: Conjunctivae, EOM and lids are normal. Pupils are equal, round, and reactive to light. No scleral icterus.  Neck: Trachea normal and normal range of motion. Thyroid mass (small nodule palpated on left lobe) present. No thyromegaly present.  Cardiovascular: Normal rate, regular rhythm, normal heart sounds and intact distal pulses.   Pulmonary/Chest: Effort normal and breath sounds normal.  Abdominal: Soft. Normal appearance and bowel sounds are normal. There is no tenderness.  Genitourinary: Vagina normal, uterus normal, cervix normal, right adnexa normal, left adnexa normal and vulva normal.  Lymphadenopathy:       Head (right side): No tonsillar, no preauricular, no posterior auricular and no occipital adenopathy present.       Head (left side): No tonsillar, no preauricular, no posterior auricular  and no occipital adenopathy present.    She has no cervical adenopathy.       Right: No supraclavicular adenopathy present.       Left: No supraclavicular adenopathy present.  Neurological: She is alert and oriented to person, place, and time. She has normal sensation, normal strength and normal reflexes. Gait normal.  Skin: Skin is warm and dry.  Psychiatric: Affect normal.    Visual Acuity Screening   Right eye Left eye Both eyes  Without correction: _0  With correction:      Results for orders placed or performed in visit on 06/20/16 (from the past 24 hour(s))  POCT CBC      Status: Abnormal   Collection Time: 06/20/16 10:02 AM  Result Value Ref Range   WBC 4.0 (A) 4.6 - 10.2 K/uL   Lymph, poc 2.1 0.6 - 3.4   POC LYMPH PERCENT 51.3 (A) 10 - 50 %L   MID (cbc) 0.3 0 - 0.9   POC MID % 6.5 0 - 12 %M   POC Granulocyte 1.7 (A) 2 - 6.9   Granulocyte percent 42.2 37 - 80 %G   RBC 4.46 4.04 - 5.48 M/uL   Hemoglobin 13.5 12.2 - 16.2 g/dL   HCT, POC 39.1 37.7 - 47.9 %   MCV 87.6 80 - 97 fL   MCH, POC 30.2 27 - 31.2 pg   MCHC 34.5 31.8 - 35.4 g/dL   RDW, POC 12.5 %   Platelet Count, POC 264 142 - 424 K/uL   MPV 7.3 0 - 99.8 fL  POCT rapid strep A     Status: None   Collection Time: 06/20/16 10:11 AM  Result Value Ref Range   Rapid Strep A Screen Negative Negative    Assessment and Plan :  Discussed healthy lifestyle, diet, exercise, preventative care, vaccinations, and addressed patient's concerns. Plan for follow up in one year. Otherwise, plan for specific conditions below.  1. Annual physical exam Await lab results.   2. Sore throat -Recommend soft foods and salt water gargles. Given cepacol samples. Instructed to return to clinic if symptoms worsen, do not improve in 5-7 days, or as needed - POCT CBC - POCT rapid strep A - Culture, Group A Strep  3. Screening for metabolic disorder - [id number]  4. Screening, lipid - Lipid panel  5. Screening for cervical cancer - Pap IG, CT/NG NAA, and HPV (high risk)  Tenna Delaine PA-C  [facility] 06/20/2016 10:11 AM

## 2016-06-21 LAB — CMP14+EGFR
A/G RATIO: 1.5 (ref 1.2–2.2)
ALBUMIN: 4.3 g/dL (ref 3.5–5.5)
ALT: 9 IU/L (ref 0–32)
AST: 22 IU/L (ref 0–40)
Alkaline Phosphatase: 84 IU/L (ref 39–117)
BUN / CREAT RATIO: 15 (ref 9–23)
BUN: 13 mg/dL (ref 6–24)
Bilirubin Total: 0.3 mg/dL (ref 0.0–1.2)
CALCIUM: 9.4 mg/dL (ref 8.7–10.2)
CO2: 25 mmol/L (ref 18–29)
CREATININE: 0.84 mg/dL (ref 0.57–1.00)
Chloride: 103 mmol/L (ref 96–106)
GFR calc Af Amer: 96 mL/min/{1.73_m2} (ref >59)
GFR, EST NON AFRICAN AMERICAN: 83 mL/min/{1.73_m2} (ref >59)
GLOBULIN, TOTAL: 2.9 g/dL (ref 1.5–4.5)
Glucose: 96 mg/dL (ref 65–99)
Potassium: 4.3 mmol/L (ref 3.5–5.2)
SODIUM: 143 mmol/L (ref 134–144)
TOTAL PROTEIN: 7.2 g/dL (ref 6.0–8.5)

## 2016-06-21 LAB — LIPID PANEL
CHOL/HDL RATIO: 3.1 ratio (ref 0.0–4.4)
Cholesterol, Total: 182 mg/dL (ref 100–199)
HDL: 59 mg/dL (ref >39)
LDL CALC: 104 mg/dL — AB (ref 0–99)
Triglycerides: 95 mg/dL (ref 0–149)
VLDL Cholesterol Cal: 19 mg/dL (ref 5–40)

## 2016-06-22 LAB — PAP IG, CT-NG NAA, HPV HIGH-RISK
Chlamydia, Nuc. Acid Amp: NEGATIVE
Gonococcus by Nucleic Acid Amp: NEGATIVE
HPV, high-risk: NEGATIVE
PAP SMEAR COMMENT: 0

## 2016-06-23 LAB — CULTURE, GROUP A STREP: Strep A Culture: NEGATIVE

## 2016-07-12 ENCOUNTER — Ambulatory Visit: Payer: Self-pay

## 2018-06-20 ENCOUNTER — Other Ambulatory Visit: Payer: Self-pay | Admitting: *Deleted

## 2018-06-20 DIAGNOSIS — Z13228 Encounter for screening for other metabolic disorders: Secondary | ICD-10-CM

## 2018-06-20 DIAGNOSIS — Z131 Encounter for screening for diabetes mellitus: Secondary | ICD-10-CM

## 2018-06-20 DIAGNOSIS — Z13220 Encounter for screening for lipoid disorders: Secondary | ICD-10-CM

## 2018-06-20 DIAGNOSIS — Z0000 Encounter for general adult medical examination without abnormal findings: Secondary | ICD-10-CM

## 2018-06-20 DIAGNOSIS — Z114 Encounter for screening for human immunodeficiency virus [HIV]: Secondary | ICD-10-CM

## 2018-06-20 DIAGNOSIS — Z130 Encounter for screening for diseases of the blood and blood-forming organs and certain disorders involving the immune mechanism: Secondary | ICD-10-CM

## 2018-06-26 ENCOUNTER — Other Ambulatory Visit: Payer: Self-pay

## 2018-06-26 ENCOUNTER — Ambulatory Visit (INDEPENDENT_AMBULATORY_CARE_PROVIDER_SITE_OTHER): Payer: Self-pay | Admitting: Family Medicine

## 2018-06-26 DIAGNOSIS — Z131 Encounter for screening for diabetes mellitus: Secondary | ICD-10-CM

## 2018-06-26 DIAGNOSIS — Z114 Encounter for screening for human immunodeficiency virus [HIV]: Secondary | ICD-10-CM

## 2018-06-26 DIAGNOSIS — Z130 Encounter for screening for diseases of the blood and blood-forming organs and certain disorders involving the immune mechanism: Secondary | ICD-10-CM

## 2018-06-26 DIAGNOSIS — Z0000 Encounter for general adult medical examination without abnormal findings: Secondary | ICD-10-CM

## 2018-06-26 DIAGNOSIS — Z13220 Encounter for screening for lipoid disorders: Secondary | ICD-10-CM

## 2018-06-26 NOTE — Progress Notes (Signed)
Nurse visit for lab only 

## 2018-06-27 ENCOUNTER — Encounter: Payer: Self-pay | Admitting: Family Medicine

## 2018-06-27 ENCOUNTER — Telehealth (INDEPENDENT_AMBULATORY_CARE_PROVIDER_SITE_OTHER): Payer: Self-pay | Admitting: Family Medicine

## 2018-06-27 DIAGNOSIS — E7800 Pure hypercholesterolemia, unspecified: Secondary | ICD-10-CM | POA: Insufficient documentation

## 2018-06-27 DIAGNOSIS — Z1211 Encounter for screening for malignant neoplasm of colon: Secondary | ICD-10-CM

## 2018-06-27 DIAGNOSIS — Z1231 Encounter for screening mammogram for malignant neoplasm of breast: Secondary | ICD-10-CM

## 2018-06-27 DIAGNOSIS — R7303 Prediabetes: Secondary | ICD-10-CM | POA: Insufficient documentation

## 2018-06-27 DIAGNOSIS — Z0000 Encounter for general adult medical examination without abnormal findings: Secondary | ICD-10-CM

## 2018-06-27 LAB — COMPREHENSIVE METABOLIC PANEL
ALT: 7 IU/L (ref 0–32)
AST: 15 IU/L (ref 0–40)
Albumin/Globulin Ratio: 1.6 (ref 1.2–2.2)
Albumin: 4.2 g/dL (ref 3.8–4.8)
Alkaline Phosphatase: 94 IU/L (ref 39–117)
BUN/Creatinine Ratio: 13 (ref 9–23)
BUN: 12 mg/dL (ref 6–24)
Bilirubin Total: <0.2 mg/dL (ref 0.0–1.2)
CO2: 23 mmol/L (ref 20–29)
Calcium: 9.9 mg/dL (ref 8.7–10.2)
Chloride: 103 mmol/L (ref 96–106)
Creatinine, Ser: 0.96 mg/dL (ref 0.57–1.00)
GFR calc Af Amer: 80 mL/min/{1.73_m2} (ref >59)
GFR calc non Af Amer: 70 mL/min/{1.73_m2} (ref >59)
Globulin, Total: 2.7 g/dL (ref 1.5–4.5)
Glucose: 108 mg/dL — ABNORMAL HIGH (ref 65–99)
Potassium: 4.5 mmol/L (ref 3.5–5.2)
Sodium: 141 mmol/L (ref 134–144)
Total Protein: 6.9 g/dL (ref 6.0–8.5)

## 2018-06-27 LAB — CBC WITH DIFFERENTIAL/PLATELET
Basophils Absolute: 0 10*3/uL (ref 0.0–0.2)
Basos: 1 %
EOS (ABSOLUTE): 0.1 10*3/uL (ref 0.0–0.4)
Eos: 1 %
Hematocrit: 38.8 % (ref 34.0–46.6)
Hemoglobin: 13.5 g/dL (ref 11.1–15.9)
Immature Grans (Abs): 0 10*3/uL (ref 0.0–0.1)
Immature Granulocytes: 0 %
Lymphocytes Absolute: 3.1 10*3/uL (ref 0.7–3.1)
Lymphs: 51 %
MCH: 28.9 pg (ref 26.6–33.0)
MCHC: 34.8 g/dL (ref 31.5–35.7)
MCV: 83 fL (ref 79–97)
Monocytes Absolute: 0.5 10*3/uL (ref 0.1–0.9)
Monocytes: 8 %
Neutrophils Absolute: 2.3 10*3/uL (ref 1.4–7.0)
Neutrophils: 39 %
Platelets: 307 10*3/uL (ref 150–450)
RBC: 4.67 x10E6/uL (ref 3.77–5.28)
RDW: 12.6 % (ref 11.7–15.4)
WBC: 5.9 10*3/uL (ref 3.4–10.8)

## 2018-06-27 LAB — HEMOGLOBIN A1C
Est. average glucose Bld gHb Est-mCnc: 123 mg/dL
Hgb A1c MFr Bld: 5.9 % — ABNORMAL HIGH (ref 4.8–5.6)

## 2018-06-27 LAB — LIPID PANEL
Chol/HDL Ratio: 3.9 ratio (ref 0.0–4.4)
Cholesterol, Total: 200 mg/dL — ABNORMAL HIGH (ref 100–199)
HDL: 51 mg/dL (ref >39)
LDL Calculated: 120 mg/dL — ABNORMAL HIGH (ref 0–99)
Triglycerides: 146 mg/dL (ref 0–149)
VLDL Cholesterol Cal: 29 mg/dL (ref 5–40)

## 2018-06-27 LAB — TSH: TSH: 2.58 u[IU]/mL (ref 0.450–4.500)

## 2018-06-27 LAB — HIV ANTIBODY (ROUTINE TESTING W REFLEX): HIV Screen 4th Generation wRfx: NONREACTIVE

## 2018-06-27 NOTE — Progress Notes (Signed)
Virtual Visit via telephone Note  I connected with patient on 06/27/18 at 946 by telephone and verified that I am speaking with the correct person using two identifiers. Kathy Porter is currently located at home and patient is currently with her during visit. The provider, Myles Lipps, MD is located in their office at time of visit.  I discussed the limitations, risks, security and privacy concerns of performing an evaluation and management service by telephone and the availability of in person appointments. I also discussed with the patient that there may be a patient responsible charge related to this service. The patient expressed understanding and agreed to proceed.   Webex visit today for CPE  HPI  G&Ps: 3/3, SVD  Pap: march 2018, neg, reports remote h/o abnormal, unknown, denies any interventions, since then normal paps STD: denies any h/o or current concerns BC : none Menses: menopause, LMP about 10 years ago Mammogram: at age 54 Colon cancer screening: at age 72 FHX breast/ovarian cancer: none FHx colon cancer: none Exercise/diet: does not exercise, does watch what she eats, weight is stable Does not see dentist Does see eye doctor, wears eye glasses Does not smoke Does not drink etoh Denies Illicit drug use  There is no immunization history on file for this patient.?  Fall Risk  06/27/2018 06/20/2016 12/06/2015  Falls in the past year? 0 No No  Number falls in past yr: 0 - -  Injury with Fall? 0 - -  Follow up Falls evaluation completed - -     Depression screen [facility] 2/9 06/27/2018 06/20/2016 12/06/2015  Decreased Interest 0 0 0  Down, Depressed, Hopeless 0 0 0  PHQ - 2 Score 0 0 0   Results for orders placed or performed in visit on 06/26/18 (from the past 24 hour(s))  HIV Antibody (routine testing w rflx)     Status: None   Collection Time: 06/26/18 11:01 AM  Result Value Ref Range   HIV Screen 4th Generation wRfx Non Reactive Non Reactive   Narrative   Performed at:  [address] [place] [address], [place], [place]  [id number] Lab Director: Jolene Schimke MD, Phone:  [phone]  Hemoglobin A1c     Status: Abnormal   Collection Time: 06/26/18 11:01 AM  Result Value Ref Range   Hgb A1c MFr Bld 5.9 (H) 4.8 - 5.6 %   Est. average glucose Bld gHb Est-mCnc 123 mg/dL   Narrative   Performed at:  [address] [place] [address] [place], [place]  [id number] Lab Director: Jolene Schimke MD, Phone:  [phone]  TSH     Status: None   Collection Time: 06/26/18 11:01 AM  Result Value Ref Range   TSH 2.580 0.450 - 4.500 uIU/mL   Narrative   Performed at:  [address] [place], [place]  [id number] Lab Director: Jolene Schimke MD, Phone:  [phone]  Lipid panel     Status: Abnormal   Collection Time: 06/26/18 11:01 AM  Result Value Ref Range   Cholesterol, Total 200 (H) 100 - 199 mg/dL   Triglycerides 162 0 - 149 mg/dL   HDL 51 >44 mg/dL   VLDL Cholesterol Cal 29 5 - 40 mg/dL   LDL Calculated 695 (H) 0 - 99 mg/dL   Chol/HDL Ratio 3.9 0.0 - 4.4 ratio   Narrative   Performed at:  [address] [place], [place]  [id number] Lab Director: Jolene Schimke MD, Phone:  [phone]  Comprehensive metabolic panel     Status: Abnormal   Collection Time: 06/26/18 11:01 AM  Result Value Ref Range   Glucose 108 (H) 65 - 99 mg/dL   BUN 12 6 - 24 mg/dL   Creatinine, Ser 1.61 0.57 - 1.00 mg/dL   GFR calc non Af Amer 70 >59 mL/min/1.73   GFR calc Af Amer 80 >59 mL/min/1.73   BUN/Creatinine Ratio 13 9 - 23   Sodium 141 134 - 144 mmol/L   Potassium 4.5 3.5 - 5.2 mmol/L   Chloride 103 96 - 106 mmol/L   CO2 23 20 - 29 mmol/L   Calcium 9.9 8.7 - 10.2 mg/dL   Total Protein 6.9 6.0 - 8.5 g/dL   Albumin 4.2 3.8 - 4.8 g/dL   Globulin, Total 2.7 1.5 - 4.5 g/dL   Albumin/Globulin Ratio 1.6 1.2 - 2.2   Bilirubin Total <0.2 0.0 - 1.2 mg/dL   Alkaline Phosphatase 94 39 - 117 IU/L   AST 15 0 - 40  IU/L   ALT 7 0 - 32 IU/L   Narrative   Performed at:  [address] [place] [address] [place], [place]  [id number] Lab Director: Jolene Schimke MD, Phone:  [phone]  CBC with Differential/Platelet     Status: None   Collection Time: 06/26/18 11:01 AM  Result Value Ref Range   WBC 5.9 3.4 - 10.8 x10E3/uL   RBC 4.67 3.77 - 5.28 x10E6/uL   Hemoglobin 13.5 11.1 - 15.9 g/dL   Hematocrit 56.2 13.0 - 46.6 %   MCV 83 79 - 97 fL   MCH 28.9 26.6 - 33.0 pg   MCHC 34.8 31.5 - 35.7 g/dL   RDW 86.5 78.4 - 69.6 %   Platelets 307 150 - 450 x10E3/uL   Neutrophils 39 Not Estab. %   Lymphs 51 Not Estab. %   Monocytes 8 Not Estab. %   Eos 1 Not Estab. %   Basos 1 Not Estab. %   Neutrophils Absolute 2.3 1.4 - 7.0 x10E3/uL   Lymphocytes Absolute 3.1 0.7 - 3.1 x10E3/uL   Monocytes Absolute 0.5 0.1 - 0.9 x10E3/uL   EOS (ABSOLUTE) 0.1 0.0 - 0.4 x10E3/uL   Basophils Absolute 0.0 0.0 - 0.2 x10E3/uL   Immature Granulocytes 0 Not Estab. %   Immature Grans (Abs) 0.0 0.0 - 0.1 x10E3/uL   Narrative   Performed at:  [address] [place] [address], [place], [place]  [id number] Lab Director: Jolene Schimke MD, Phone:  [phone]    Wt Readings from Last 3 Encounters:  06/20/16 186 lb (84.4 kg)  12/06/15 181 lb (82.1 kg)  12/15/14 170 lb (77.1 kg)   BP Readings from Last 3 Encounters:  06/20/16 127/80  12/06/15 138/76  12/15/14 126/79    No Known Allergies  Prior to Admission medications   Not on File    Past Medical History:  Diagnosis Date  . Arthritis     No past surgical history on file.  Social History   Tobacco Use  . Smoking status: Never Smoker  . Smokeless tobacco: Never Used  Substance Use Topics  . Alcohol use: No    Family History  Problem Relation Age of Onset  . Stroke Father     ROS Denies any fever, chills Denies any vision changes, hearing changes Having some intermittent left sided headaches, stabbing, respond very well to excedrin migraine  Denies any chest pain, palpitations, edema Denies any cough, SOB Denies any abd pain, nausea, vomiting Other  ROS neg  Objective  Vitals as reported by the patient: none  There were no vitals filed for this visit.  ASSESSMENT and PLAN  1. Annual physical exam Routine HCM labs ordered. HCM reviewed/discussed. Anticipatory guidance regarding healthy weight, lifestyle and choices given.   2. Visit for screening mammogram - Ambulatory Referral to [facility]  3. Colon cancer screening - IFOBT POC (occult bld, rslt in office); Future  4. Pre-diabetes New diagnosis. Reviewed LFM  5. Pure hypercholesterolemia New diagnosis. Reviewed LFM  FOLLOW-UP: 6 months   The above assessment and management plan was discussed with the patient. The patient verbalized understanding of and has agreed to the management plan. Patient is aware to call the clinic if symptoms persist or worsen. Patient is aware when to return to the clinic for a follow-up visit. Patient educated on when it is appropriate to go to the emergency department.    More than 50% of this 30 minute face-to-face time via webex encounter was spent on counseling and coordination of care.  Myles Lipps, MD Primary Care at [facility] [address] [place], [place] [address] Ph.  [phone] Fax [phone]

## 2018-12-25 ENCOUNTER — Ambulatory Visit: Payer: Self-pay | Admitting: Family Medicine

## 2018-12-26 ENCOUNTER — Encounter: Payer: Self-pay | Admitting: Family Medicine

## 2019-07-07 ENCOUNTER — Ambulatory Visit: Payer: Self-pay | Admitting: Adult Health Nurse Practitioner

## 2019-07-08 ENCOUNTER — Encounter: Payer: Self-pay | Admitting: Adult Health Nurse Practitioner

## 2019-12-16 ENCOUNTER — Telehealth: Payer: Self-pay | Admitting: *Deleted

## 2019-12-16 NOTE — Telephone Encounter (Signed)
Schedule mammogram mobile unit 9-17 @ Primary Care at [facility]

## 2019-12-16 NOTE — Telephone Encounter (Signed)
12/16/2019 - Kathy Porter - PATIENT IS RETURNING YOUR CALL REGARDING THE MOBILE MAMMOGRAM. BEST PHONE [phone] (CELL) MBC

## 2019-12-16 NOTE — Telephone Encounter (Signed)
Patient declined mammo at this time

## 2020-03-02 ENCOUNTER — Other Ambulatory Visit: Payer: Self-pay

## 2020-03-02 ENCOUNTER — Ambulatory Visit (HOSPITAL_COMMUNITY)
Admission: EM | Admit: 2020-03-02 | Discharge: 2020-03-02 | Disposition: A | Discharge status: Home / Self Care | Payer: No Typology Code available for payment source | Attending: Family Medicine | Admitting: Family Medicine

## 2020-03-02 ENCOUNTER — Encounter (HOSPITAL_COMMUNITY): Payer: Self-pay

## 2020-03-02 ENCOUNTER — Ambulatory Visit (INDEPENDENT_AMBULATORY_CARE_PROVIDER_SITE_OTHER): Payer: No Typology Code available for payment source

## 2020-03-02 DIAGNOSIS — J90 Pleural effusion, not elsewhere classified: Secondary | ICD-10-CM

## 2020-03-02 DIAGNOSIS — R0602 Shortness of breath: Secondary | ICD-10-CM

## 2020-03-02 MED ORDER — HYDROCODONE-ACETAMINOPHEN 5-325 MG PO TABS: 2.0000 | ORAL_TABLET | ORAL | 0 refills | Status: DC | PRN

## 2020-03-02 NOTE — ED Provider Notes (Signed)
[facility]    CSN: [id number] Arrival date & time: 03/02/20  1518      History   Chief Complaint Chief Complaint  Patient presents with  . Chest Pain  . Shortness of Breath    HPI Ling Halseth is a 49 y.o. female.   Patient has left-sided chest and shoulder pain.  Began yesterday.  Not related to exertion.  There is no substernal component.  She does not have risk factors for cardiac disease.  She denies cough or fever.  There is some shortness of breath.  HPI  Past Medical History:  Diagnosis Date  . Arthritis     Patient Active Problem List   Diagnosis Date Noted  . Pre-diabetes 06/27/2018  . Pure hypercholesterolemia 06/27/2018    History reviewed. No pertinent surgical history.  OB History   No obstetric history on file.      Home Medications    Prior to Admission medications   Not on File    Family History Family History  Problem Relation Age of Onset  . Stroke Father     Social History Social History   Tobacco Use  . Smoking status: Never Smoker  . Smokeless tobacco: Never Used  Substance Use Topics  . Alcohol use: No  . Drug use: No     Allergies   Patient has no known allergies.   Review of Systems Review of Systems  Respiratory: Positive for shortness of breath.   Cardiovascular: Positive for chest pain.       Pain is pleuritic and left-sided     Physical Exam Triage Vital Signs ED Triage Vitals  Enc Vitals Group     BP 03/02/20 1645 (!) 141/63     Pulse Rate 03/02/20 1645 73     Resp 03/02/20 1645 20     Temp 03/02/20 1645 98.7 F (37.1 C)     Temp Source 03/02/20 1645 Oral     SpO2 03/02/20 1645 100 %     Weight --      Height --      Head Circumference --      Peak Flow --      Pain Score 03/02/20 1658 8     Pain Loc --      Pain Edu? --      Excl. in GC? --    No data found.  Updated Vital Signs BP (!) 141/63 (BP Location: Right Arm)   Pulse 73   Temp 98.7 F (37.1 C) (Oral)   Resp 20    LMP  (LMP Unknown)   SpO2 100%   Visual Acuity Right Eye Distance:   Left Eye Distance:   Bilateral Distance:    Right Eye Near:   Left Eye Near:    Bilateral Near:     Physical Exam Vitals and nursing note reviewed.  Constitutional:      Appearance: She is well-developed.  Cardiovascular:     Rate and Rhythm: Normal rate and regular rhythm.     Heart sounds: Normal heart sounds.  Pulmonary:     Effort: Pulmonary effort is normal.     Breath sounds: Normal breath sounds.  Neurological:     General: No focal deficit present.     Mental Status: She is alert and oriented to person, place, and time.      UC Treatments / Results  Labs (all labs ordered are listed, but only abnormal results are displayed) Labs Reviewed - No data to display  EKG   Radiology No results found.  Procedures Procedures (including critical care time)  Medications Ordered in UC Medications - No data to display  Initial Impression / Assessment and Plan / UC Course  I have reviewed the triage vital signs and the nursing notes.  Pertinent labs & imaging results that were available during my care of the patient were reviewed by me and considered in my medical decision making (see chart for details).     Left pleural effusion of uncertain etiology.  Patient will need CT scan to further assess.  Will go through [place] primary care for ordering further study Final Clinical Impressions(s) / UC Diagnoses   Final diagnoses:  None   Discharge Instructions   None    ED Prescriptions    None     PDMP not reviewed this encounter.   Frederica Kuster, MD 03/02/20 [facility]

## 2020-03-02 NOTE — ED Triage Notes (Signed)
Pt c/o left CP 8/10 radiating to left arm, left rib cage and left back region with lightheadedness acute onset yesterday upon waking.Pt reports after eating last night pain occurred to LUQ region. Pt states pain increases significantly with inspiration, movement, cough, sneezing.  CP non reproducible on palpation.   Denies any known trauma or recent exertional movement, recent illness, diaphoresis, n/v, changes to vision.  Took 400mg  ibuprofen at 1200  EKG performed and results given to Dr. who advised that pt can be evaluated here and order for CXR received.

## 2020-03-03 ENCOUNTER — Emergency Department (HOSPITAL_COMMUNITY): Payer: No Typology Code available for payment source

## 2020-03-03 ENCOUNTER — Emergency Department (HOSPITAL_COMMUNITY)
Admission: EM | Admit: 2020-03-03 | Discharge: 2020-03-03 | Disposition: A | Discharge status: Home / Self Care | Payer: No Typology Code available for payment source | Attending: Emergency Medicine | Admitting: Emergency Medicine

## 2020-03-03 ENCOUNTER — Other Ambulatory Visit: Payer: Self-pay

## 2020-03-03 ENCOUNTER — Encounter (HOSPITAL_COMMUNITY): Payer: Self-pay

## 2020-03-03 DIAGNOSIS — M549 Dorsalgia, unspecified: Secondary | ICD-10-CM | POA: Insufficient documentation

## 2020-03-03 DIAGNOSIS — R0781 Pleurodynia: Secondary | ICD-10-CM | POA: Insufficient documentation

## 2020-03-03 LAB — URINALYSIS, ROUTINE W REFLEX MICROSCOPIC
Bilirubin Urine: NEGATIVE
Glucose, UA: NEGATIVE mg/dL
Hgb urine dipstick: NEGATIVE
Ketones, ur: NEGATIVE mg/dL
Leukocytes,Ua: NEGATIVE
Nitrite: NEGATIVE
Protein, ur: NEGATIVE mg/dL
Specific Gravity, Urine: 1.019 (ref 1.005–1.030)
pH: 5 (ref 5.0–8.0)

## 2020-03-03 LAB — CBC
HCT: 39.2 % (ref 36.0–46.0)
Hemoglobin: 13.1 g/dL (ref 12.0–15.0)
MCH: 29 pg (ref 26.0–34.0)
MCHC: 33.4 g/dL (ref 30.0–36.0)
MCV: 86.7 fL (ref 80.0–100.0)
Platelets: 280 10*3/uL (ref 150–400)
RBC: 4.52 MIL/uL (ref 3.87–5.11)
RDW: 12.4 % (ref 11.5–15.5)
WBC: 6.6 10*3/uL (ref 4.0–10.5)
nRBC: 0 % (ref 0.0–0.2)

## 2020-03-03 LAB — BASIC METABOLIC PANEL
Anion gap: 9 (ref 5–15)
BUN: 11 mg/dL (ref 6–20)
CO2: 25 mmol/L (ref 22–32)
Calcium: 9.4 mg/dL (ref 8.9–10.3)
Chloride: 104 mmol/L (ref 98–111)
Creatinine, Ser: 0.94 mg/dL (ref 0.44–1.00)
GFR, Estimated: >60 mL/min (ref >60)
Glucose, Bld: 112 mg/dL — ABNORMAL HIGH (ref 70–99)
Potassium: 3.7 mmol/L (ref 3.5–5.1)
Sodium: 138 mmol/L (ref 135–145)

## 2020-03-03 LAB — TROPONIN I (HIGH SENSITIVITY)
Troponin I (High Sensitivity): 3 ng/L (ref <18)
Troponin I (High Sensitivity): <2 ng/L (ref <18)

## 2020-03-03 MED ORDER — IOHEXOL 350 MG/ML SOLN
57.0000 mL | Freq: Once | INTRAVENOUS | Status: AC | PRN
  Administered 2020-03-03: 57 mL via INTRAVENOUS

## 2020-03-03 NOTE — Discharge Instructions (Signed)
Neck the hydrocodone prescribed yesterday for the chest wall pain.  Today's CAT scan showed no evidence of pneumonia did show a small amount of fluid on the left side.  No evidence of any blood clots in the lungs.  Make an appointment to follow-up with your doctors or the wellness clinic.  Return for any new or worse symptoms.

## 2020-03-03 NOTE — ED Provider Notes (Signed)
[facility] EMERGENCY DEPARTMENT Provider Note   CSN: [id number] Arrival date & time: 03/03/20  0941     History Chief Complaint  Patient presents with  . Chest Pain    Kathy Porter is a 49 y.o. female.  Patient presenting with a complaint left sided chest pain that seems to be pleuritic in nature.  Patient was seen at urgent care yesterday had a chest x-ray that raise concerns for significant pleural effusion.  They did send in a prescription for hydrocodone for her.  Patient states that the chest pains on the left side upper part 8 out of 10 goes to the left arm.  And left back pain is worse with movement taking deep breaths cough and sneezing.  No fevers.  No history of injury.  Pains been going on for a few days she has not had any of the Covid vaccines recently.  Last was back in April        Past Medical History:  Diagnosis Date  . Arthritis     Patient Active Problem List   Diagnosis Date Noted  . Pre-diabetes 06/27/2018  . Pure hypercholesterolemia 06/27/2018    History reviewed. No pertinent surgical history.   OB History   No obstetric history on file.     Family History  Problem Relation Age of Onset  . Stroke Father     Social History   Tobacco Use  . Smoking status: Never Smoker  . Smokeless tobacco: Never Used  Substance Use Topics  . Alcohol use: No  . Drug use: No    Home Medications Prior to Admission medications   Medication Sig Start Date End Date Taking? Authorizing Provider  HYDROcodone-acetaminophen (NORCO/VICODIN) 5-325 MG tablet Take 2 tablets by mouth every 4 (four) hours as needed. 03/02/20   Frederica Kuster, MD    Allergies    Patient has no known allergies.  Review of Systems   Review of Systems  Constitutional: Negative for chills and fever.  HENT: Negative for congestion, rhinorrhea and sore throat.   Eyes: Negative for visual disturbance.  Respiratory: Negative for cough and shortness of  breath.   Cardiovascular: Positive for chest pain. Negative for leg swelling.  Gastrointestinal: Negative for abdominal pain, diarrhea, nausea and vomiting.  Genitourinary: Negative for dysuria.  Musculoskeletal: Positive for back pain. Negative for neck pain.  Skin: Negative for rash.  Neurological: Negative for dizziness, light-headedness and headaches.  Hematological: Does not bruise/bleed easily.  Psychiatric/Behavioral: Negative for confusion.    Physical Exam Updated Vital Signs BP 135/76 (BP Location: Right Arm)   Pulse 75   Temp 98.1 F (36.7 C) (Oral)   Resp 20   Ht 1.702 m (5\' 7" )   Wt 84.4 kg   LMP  (LMP Unknown)   SpO2 99%   BMI 29.13 kg/m   Physical Exam Vitals and nursing note reviewed.  Constitutional:      General: She is not in acute distress.    Appearance: Normal appearance. She is well-developed and well-nourished.  HENT:     Head: Normocephalic and atraumatic.  Eyes:     Extraocular Movements: Extraocular movements intact.     Conjunctiva/sclera: Conjunctivae normal.     Pupils: Pupils are equal, round, and reactive to light.  Cardiovascular:     Rate and Rhythm: Normal rate and regular rhythm.     Heart sounds: No murmur heard.   Pulmonary:     Effort: Pulmonary effort is normal. No respiratory distress.  Breath sounds: Normal breath sounds.  Chest:     Chest wall: No tenderness.  Abdominal:     Palpations: Abdomen is soft.     Tenderness: There is no abdominal tenderness.  Musculoskeletal:        General: No swelling or edema.     Cervical back: Normal range of motion and neck supple.  Skin:    General: Skin is warm and dry.  Neurological:     General: No focal deficit present.     Mental Status: She is alert and oriented to person, place, and time.     Cranial Nerves: No cranial nerve deficit.     Sensory: No sensory deficit.     Motor: No weakness.  Psychiatric:        Mood and Affect: Mood and affect normal.     ED Results  / Procedures / Treatments   Labs (all labs ordered are listed, but only abnormal results are displayed) Labs Reviewed  BASIC METABOLIC PANEL - Abnormal; Notable for the following components:      Result Value   Glucose, Bld 112 (*)    All other components within normal limits  URINALYSIS, ROUTINE W REFLEX MICROSCOPIC - Abnormal; Notable for the following components:   APPearance HAZY (*)    All other components within normal limits  CBC  TROPONIN I (HIGH SENSITIVITY)  TROPONIN I (HIGH SENSITIVITY)    EKG EKG Interpretation  Date/Time:  Thursday March 03 2020 09:50:54 EST Ventricular Rate:  76 PR Interval:  132 QRS Duration: 74 QT Interval:  348 QTC Calculation: 391 R Axis:   21 Text Interpretation: Normal sinus rhythm T wave abnormality, consider inferior ischemia Abnormal ECG No significant change since last tracing Confirmed by Vanetta Mulders [phone]) on 03/03/2020 12:33:39 PM   Radiology DG Chest 2 View  Result Date: 03/03/2020 CLINICAL DATA:  Chest pain. EXAM: CHEST - 2 VIEW COMPARISON:  03/02/2020. FINDINGS: Mediastinum is normal. Mild left hilar prominence again noted. Heart size normal. Low lung volumes. Left base infiltrate and small left pleural effusion. No pneumothorax. IMPRESSION: 1. As noted on prior study mild left hilar prominence is present. Contrast-enhanced chest CT can be obtained to further evaluate. 2. Low lung volumes. Left base infiltrate consistent pneumonia. Small left pleural effusion. Electronically Signed   By: Maisie Fus  Register   On: 03/03/2020 10:31   DG Chest 2 View  Result Date: 03/02/2020 CLINICAL DATA:  Shortness of breath.  Left-sided chest pain. EXAM: CHEST - 2 VIEW COMPARISON:  One-view chest x-ray 01/26/2011. FINDINGS: Heart size is normal. A left pleural effusion is present. Mild prominence of the left hilum is noted. No definite airspace disease is present. IMPRESSION: 1. Left pleural effusion. 2. Mild prominence of the left hilum. 3.  Consider CT of the chest with contrast for further evaluation of new pleural effusion and left hilar fullness. Electronically Signed   By: Marin Roberts M.D.   On: 03/02/2020 17:59   CT Angio Chest PE W/Cm &/Or Wo Cm  Result Date: 03/03/2020 CLINICAL DATA:  Chest pain rule out pulmonary embolism. EXAM: CT ANGIOGRAPHY CHEST WITH CONTRAST TECHNIQUE: Multidetector CT imaging of the chest was performed using the standard protocol during bolus administration of intravenous contrast. Multiplanar CT image reconstructions and MIPs were obtained to evaluate the vascular anatomy. CONTRAST:  7mL OMNIPAQUE IOHEXOL 350 MG/ML SOLN COMPARISON:  Chest two-view 03/03/2020 FINDINGS: Cardiovascular: Negative for pulmonary embolism. Normal thoracic aorta. Mild cardiac enlargement without pericardial effusion. Mediastinum/Nodes: No pathologic lymph node.  Small Peri coronal lymph nodes. Left AP window lymph node 9 mm. 15 mm cystic nodule left lobe of thyroid. Lungs/Pleura: Left lower lobe airspace disease most consistent with dependent atelectasis with small left effusion. Right lung clear. No lung nodule identified. Upper Abdomen: No acute abnormality. Musculoskeletal: No acute skeletal abnormality. Review of the MIP images confirms the above findings. IMPRESSION: 1. Negative for pulmonary embolism.  Negative for aortic dissection 2. Left lower lobe atelectasis and small left effusion 3. 15 mm nodule left lobe of thyroid. Recommend thyroid ultrasound. (Ref: J Am Coll Radiol. 2015 Feb;12(2): 143-50). Electronically Signed   By: Marlan Palau M.D.   On: 03/03/2020 13:57    Procedures Procedures (including critical care time)  Medications Ordered in ED Medications  iohexol (OMNIPAQUE) 350 MG/ML injection 57 mL (57 mLs Intravenous Contrast Given 03/03/20 1334)    ED Course  I have reviewed the triage vital signs and the nursing notes.  Pertinent labs & imaging results that were available during my care of the  patient were reviewed by me and considered in my medical decision making (see chart for details).    MDM Rules/Calculators/A&P                           Chest x-ray yesterday raise concerns for significant pleural effusion.  Patient's chest pain is pleuritic nature.  We will do CT angio here today.  Chest x-ray repeated today also raises concerns for pneumonia.  CT angio chest showed small pleural effusion no pneumonia no pulmonary embolus.  Will treat for pleuritic chest pain.  Patient states she has somebody to follow-up with.  Patient also given referral to wellness clinic.  Will treat with pain medicine and actually patient was given prescription for this yesterday.  Patient does not need work note.  Opponents x2 were normal.  Not suggestive of an acute cardiac event.  Patient does have some cardiac risk factors.  But pain is very pleuritic in nature.      Final Clinical Impression(s) / ED Diagnoses Final diagnoses:  Pleuritic chest pain    Rx / DC Orders ED Discharge Orders    None       Vanetta Mulders, MD 03/03/20 [phone]

## 2020-03-03 NOTE — ED Triage Notes (Signed)
Pt reports left cp 8/10 to left arm, left rib cage and left back pain occurring with LUQ pain. Pt reports increase pain with movement and cough and sneezing.

## 2020-03-03 NOTE — ED Notes (Signed)
Pt states that she has pain in the top portion of her shoulder that radiates down the left scapula. Pt stated that it hurt more when she took deep breathes for assessment. S1 S2 clear, lung clear.

## 2020-04-18 ENCOUNTER — Other Ambulatory Visit: Payer: Self-pay

## 2020-04-18 ENCOUNTER — Ambulatory Visit (INDEPENDENT_AMBULATORY_CARE_PROVIDER_SITE_OTHER): Payer: Self-pay | Admitting: Registered Nurse

## 2020-04-18 ENCOUNTER — Encounter: Payer: Self-pay | Admitting: Registered Nurse

## 2020-04-18 ENCOUNTER — Ambulatory Visit (INDEPENDENT_AMBULATORY_CARE_PROVIDER_SITE_OTHER): Payer: Self-pay

## 2020-04-18 VITALS — BP 160/92 | HR 91 | Temp 97.9°F | Resp 18 | Ht 67.0 in | Wt 185.6 lb

## 2020-04-18 DIAGNOSIS — J90 Pleural effusion, not elsewhere classified: Secondary | ICD-10-CM

## 2020-04-18 NOTE — Progress Notes (Signed)
Established Patient Office Visit  Subjective:  Patient ID: Kathy Porter, female    DOB: 08/04/1970  Age: 50 y.o. MRN: [id number]  CC:  Chief Complaint  Patient presents with  . Pain    Patient states she is here because she is having pain on her left side and back since 02/2020. Per patient she was given Hydrocodone but she only had 10 and continue to take ibuprofen and still in pain and feels likes she is getting pinched.    HPI Kathy Porter presents for ongoing rib pain  Onset in late Nov or early Dec. Seen on Dec 9 by ER Chest xray and ct angio revealed no PE but did show pleural effusion.  Given pain control and asked to follow up with primary care.  Presents today with consistent pain. Feels like it's worsened. Can't take deep breaths Does note that she has lost some weight Does note some coughing No hemoptysis, no night sweats, no GI or neuro symptoms No sick contacts No other symptoms suggestive of infection.  Past Medical History:  Diagnosis Date  . Arthritis     No past surgical history on file.  Family History  Problem Relation Age of Onset  . Stroke Father     Social History   Socioeconomic History  . Marital status: Single    Spouse name: Not on file  . Number of children: Not on file  . Years of education: Not on file  . Highest education level: Not on file  Occupational History  . Not on file  Tobacco Use  . Smoking status: Never Smoker  . Smokeless tobacco: Never Used  Substance and Sexual Activity  . Alcohol use: No  . Drug use: No  . Sexual activity: Never  Other Topics Concern  . Not on file  Social History Narrative  . Not on file   Social Determinants of Health   Financial Resource Strain: Not on file  Food Insecurity: Not on file  Transportation Needs: Not on file  Physical Activity: Not on file  Stress: Not on file  Social Connections: Not on file  Intimate Partner Violence: Not on file    Outpatient Medications  Prior to Visit  Medication Sig Dispense Refill  . HYDROcodone-acetaminophen (NORCO/VICODIN) 5-325 MG tablet Take 2 tablets by mouth every 4 (four) hours as needed. (Patient not taking: Reported on 04/18/2020) 10 tablet 0   No facility-administered medications prior to visit.    No Known Allergies  ROS Review of Systems  Constitutional: Negative.   HENT: Negative.   Eyes: Negative.   Respiratory: Negative.   Cardiovascular: Negative.   Gastrointestinal: Negative.   Genitourinary: Negative.   Musculoskeletal: Negative.   Skin: Negative.   Neurological: Negative.   Psychiatric/Behavioral: Negative.   All other systems reviewed and are negative.     Objective:    Physical Exam Vitals and nursing note reviewed.  Constitutional:      General: She is not in acute distress.    Appearance: Normal appearance. She is normal weight. She is not ill-appearing, toxic-appearing or diaphoretic.  Cardiovascular:     Rate and Rhythm: Normal rate and regular rhythm.     Heart sounds: Normal heart sounds. No murmur heard. No friction rub. No gallop.   Pulmonary:     Effort: Pulmonary effort is normal. No respiratory distress.     Breath sounds: Normal breath sounds. No stridor. No wheezing, rhonchi or rales.  Chest:     Chest wall: No tenderness.  Skin:    General: Skin is warm and dry.  Neurological:     General: No focal deficit present.     Mental Status: She is alert and oriented to person, place, and time. Mental status is at baseline.  Psychiatric:        Mood and Affect: Mood normal.        Behavior: Behavior normal.        Thought Content: Thought content normal.        Judgment: Judgment normal.     BP (!) 160/92   Pulse 91   Temp 97.9 F (36.6 C) (Temporal)   Resp 18   Ht 5\' 7"  (1.702 m)   Wt 185 lb 9.6 oz (84.2 kg)   LMP  (LMP Unknown)   SpO2 96%   BMI 29.07 kg/m  Wt Readings from Last 3 Encounters:  04/18/20 185 lb 9.6 oz (84.2 kg)  03/03/20 186 lb (84.4 kg)   06/20/16 186 lb (84.4 kg)     There are no preventive care reminders to display for this patient.  There are no preventive care reminders to display for this patient.  Lab Results  Component Value Date   TSH 2.580 06/26/2018   Lab Results  Component Value Date   WBC 6.6 03/03/2020   HGB 13.1 03/03/2020   HCT 39.2 03/03/2020   MCV 86.7 03/03/2020   PLT 280 03/03/2020   Lab Results  Component Value Date   NA 138 03/03/2020   K 3.7 03/03/2020   CO2 25 03/03/2020   GLUCOSE 112 (H) 03/03/2020   BUN 11 03/03/2020   CREATININE 0.94 03/03/2020   BILITOT <0.2 06/26/2018   ALKPHOS 94 06/26/2018   AST 15 06/26/2018   ALT 7 06/26/2018   PROT 6.9 06/26/2018   ALBUMIN 4.2 06/26/2018   CALCIUM 9.4 03/03/2020   ANIONGAP 9 03/03/2020   Lab Results  Component Value Date   CHOL 200 (H) 06/26/2018   Lab Results  Component Value Date   HDL 51 06/26/2018   Lab Results  Component Value Date   LDLCALC 120 (H) 06/26/2018   Lab Results  Component Value Date   TRIG 146 06/26/2018   Lab Results  Component Value Date   CHOLHDL 3.9 06/26/2018   Lab Results  Component Value Date   HGBA1C 5.9 (H) 06/26/2018      Assessment & Plan:   Problem List Items Addressed This Visit   None   Visit Diagnoses    Pleural effusion    -  Primary   Relevant Orders   DG Chest 2 View (Completed)   CBC With Differential   Basic Metabolic Panel   QuantiFERON-TB Gold Plus   Ambulatory referral to Pulmonology      No orders of the defined types were placed in this encounter.   Follow-up: No follow-ups on file.   PLAN  Dg chest shows progression of pleural effusion.  Will order cbc, bmp, tb gold  Concern for TB given weight loss and cough.  Refer to pulmonology for further work up  Will try to handle as much as possible through our office given that the patient does not have insurance at this time.  Patient encouraged to call clinic with any questions, comments, or  concerns.  08/26/2018, NP

## 2020-04-18 NOTE — Patient Instructions (Signed)
° ° ° °  If you have lab work done today you will be contacted with your lab results within the next 2 weeks.  If you have not heard from us then please contact us. The fastest way to get your results is to register for My Chart. ° ° °IF you received an x-ray today, you will receive an invoice from [facility]. Please contact [facility] at [phone] with questions or concerns regarding your invoice.  ° °IF you received labwork today, you will receive an invoice from [facility]. Please contact [facility] at [phone] with questions or concerns regarding your invoice.  ° °Our billing staff will not be able to assist you with questions regarding bills from these companies. ° °You will be contacted with the lab results as soon as they are available. The fastest way to get your results is to activate your My Chart account. Instructions are located on the last page of this paperwork. If you have not heard from us regarding the results in 2 weeks, please contact this office. °  ° ° ° °

## 2020-04-19 ENCOUNTER — Telehealth: Payer: Self-pay | Admitting: General Practice

## 2020-04-19 ENCOUNTER — Other Ambulatory Visit: Payer: Self-pay | Admitting: Registered Nurse

## 2020-04-19 DIAGNOSIS — J90 Pleural effusion, not elsewhere classified: Secondary | ICD-10-CM

## 2020-04-19 MED ORDER — HYDROCODONE-ACETAMINOPHEN 5-325 MG PO TABS: 1.0000 | ORAL_TABLET | Freq: Four times a day (QID) | ORAL | 0 refills | Status: DC | PRN

## 2020-04-19 NOTE — Telephone Encounter (Signed)
Pt was under the impression she would be given pain medication following yesterdays appt. Please advise

## 2020-04-19 NOTE — Telephone Encounter (Signed)
Sent Thanks Rich

## 2020-04-20 LAB — QUANTIFERON-TB GOLD PLUS
QuantiFERON Mitogen Value: >10 IU/mL
QuantiFERON Nil Value: 0.12 IU/mL
QuantiFERON TB1 Ag Value: 0.09 IU/mL
QuantiFERON TB2 Ag Value: 0.21 IU/mL
QuantiFERON-TB Gold Plus: NEGATIVE

## 2020-04-20 LAB — CBC WITH DIFFERENTIAL
Basophils Absolute: 0 10*3/uL (ref 0.0–0.2)
Basos: 0 %
EOS (ABSOLUTE): 0.1 10*3/uL (ref 0.0–0.4)
Eos: 2 %
Hematocrit: 38.7 % (ref 34.0–46.6)
Hemoglobin: 13.4 g/dL (ref 11.1–15.9)
Immature Grans (Abs): 0 10*3/uL (ref 0.0–0.1)
Immature Granulocytes: 0 %
Lymphocytes Absolute: 1.8 10*3/uL (ref 0.7–3.1)
Lymphs: 32 %
MCH: 28.4 pg (ref 26.6–33.0)
MCHC: 34.6 g/dL (ref 31.5–35.7)
MCV: 82 fL (ref 79–97)
Monocytes Absolute: 0.5 10*3/uL (ref 0.1–0.9)
Monocytes: 8 %
Neutrophils Absolute: 3.3 10*3/uL (ref 1.4–7.0)
Neutrophils: 58 %
RBC: 4.72 x10E6/uL (ref 3.77–5.28)
RDW: 12.1 % (ref 11.7–15.4)
WBC: 5.8 10*3/uL (ref 3.4–10.8)

## 2020-04-20 LAB — BASIC METABOLIC PANEL
BUN/Creatinine Ratio: 12 (ref 9–23)
BUN: 12 mg/dL (ref 6–24)
CO2: 25 mmol/L (ref 20–29)
Calcium: 9.6 mg/dL (ref 8.7–10.2)
Chloride: 101 mmol/L (ref 96–106)
Creatinine, Ser: 1 mg/dL (ref 0.57–1.00)
GFR calc Af Amer: 75 mL/min/{1.73_m2} (ref >59)
GFR calc non Af Amer: 65 mL/min/{1.73_m2} (ref >59)
Glucose: 129 mg/dL — ABNORMAL HIGH (ref 65–99)
Potassium: 4.4 mmol/L (ref 3.5–5.2)
Sodium: 140 mmol/L (ref 134–144)

## 2020-05-12 ENCOUNTER — Institutional Professional Consult (permissible substitution): Payer: No Typology Code available for payment source | Admitting: Pulmonary Disease

## 2020-06-14 ENCOUNTER — Other Ambulatory Visit: Payer: Self-pay

## 2020-06-14 ENCOUNTER — Ambulatory Visit (INDEPENDENT_AMBULATORY_CARE_PROVIDER_SITE_OTHER): Payer: No Typology Code available for payment source

## 2020-06-14 ENCOUNTER — Encounter: Payer: Self-pay | Admitting: Pulmonary Disease

## 2020-06-14 ENCOUNTER — Ambulatory Visit (INDEPENDENT_AMBULATORY_CARE_PROVIDER_SITE_OTHER): Payer: Self-pay | Admitting: Pulmonary Disease

## 2020-06-14 ENCOUNTER — Telehealth: Payer: Self-pay | Admitting: Pulmonary Disease

## 2020-06-14 VITALS — BP 132/84 | HR 114 | Temp 98.3°F | Ht 67.0 in | Wt 174.4 lb

## 2020-06-14 DIAGNOSIS — R079 Chest pain, unspecified: Secondary | ICD-10-CM

## 2020-06-14 MED ORDER — CLARITHROMYCIN 500 MG PO TABS: 500.0000 mg | ORAL_TABLET | Freq: Two times a day (BID) | ORAL | 7 refills | Status: DC

## 2020-06-14 NOTE — Progress Notes (Signed)
Kathy Porter    [id number]    Nov 17, 1970  Primary Care Physician:Patient, No Pcp Per  Referring Physician: Janeece Agee, NP [address] [place],  [place] [address]  Chief complaint:   Patient being seen for pleuritic chest pain  HPI:  Has a pleuritic chest pain, chest discomfort for almost 3 months Evaluated back in December Noted to have a pleural effusion She reports not being treated with any antibiotics or any other treatments at the time In January she had more evaluation for similar problems and had a chest x-ray showing increased pleural effusion Had a QuantiFERON test which was negative  Reformed smoker-only smokes about 1-2 sticks of cigarettes a day  No underlying lung disease or health problems Not on any medications long-term  She is not coughing, not bringing up any secretions She does occasionally have chills  She has had decrease in appetite with some weight loss No diarrhea, occasional constipation  No significant family history, dad has hypertension  No health problems growing up Grew up in [place] She was recently back in [place] between May and August  Reports that she had a dog bite on her finger back in [place] but should not feel ill or have any symptoms-dog was fully vaccinated according to her   Outpatient Encounter Medications as of 06/14/2020  Medication Sig  . HYDROcodone-acetaminophen (NORCO/VICODIN) 5-325 MG tablet Take 1-2 tablets by mouth every 6 (six) hours as needed. (Patient not taking: Reported on 06/14/2020)   No facility-administered encounter medications on file as of 06/14/2020.    Allergies as of 06/14/2020  . (No Known Allergies)    Past Medical History:  Diagnosis Date  . Arthritis     History reviewed. No pertinent surgical history.  Family History  Problem Relation Age of Onset  . Stroke Father     Social History   Socioeconomic History  . Marital status: Single    Spouse name: Not on file   . Number of children: Not on file  . Years of education: Not on file  . Highest education level: Not on file  Occupational History  . Not on file  Tobacco Use  . Smoking status: Never Smoker  . Smokeless tobacco: Never Used  Substance and Sexual Activity  . Alcohol use: No  . Drug use: No  . Sexual activity: Never  Other Topics Concern  . Not on file  Social History Narrative  . Not on file   Social Determinants of Health   Financial Resource Strain: Not on file  Food Insecurity: Not on file  Transportation Needs: Not on file  Physical Activity: Not on file  Stress: Not on file  Social Connections: Not on file  Intimate Partner Violence: Not on file    Review of Systems  Constitutional: Positive for fatigue.  Gastrointestinal: Positive for constipation. Negative for diarrhea.       Decreased appetite  Musculoskeletal: Positive for myalgias.    Vitals:   06/14/20 0941  BP: 132/84  Pulse: (!) 114  Temp: 98.3 F (36.8 C)  SpO2: 100%     Physical Exam Constitutional:      Appearance: She is obese.  HENT:     Head: Normocephalic and atraumatic.     Mouth/Throat:     Mouth: Mucous membranes are moist.  Cardiovascular:     Rate and Rhythm: Normal rate and regular rhythm.  Pulmonary:     Effort: No respiratory distress.  Breath sounds: No stridor. No wheezing or rhonchi.     Comments: Decreased air movement left base Musculoskeletal:     Cervical back: No rigidity or tenderness.  Neurological:     Mental Status: She is alert.  Psychiatric:        Mood and Affect: Mood normal.    Data Reviewed: Chest x-ray from 04/18/2020 reviewed-left pleural effusion  CT scan from December 2021 reviewed -Left lower lobe pneumonia  QuantiFERON noted to be negative  Assessment:  Abnormal chest x-ray showing chronic pleural effusion  Symptoms of shortness of breath, pleuritic pain -Was not treated with any course of antibiotics previously  Repeat chest x-ray  reviewed showing some improvement in the effusion  Plan/Recommendations: Follow-up on chest x-ray  Course of Biaxin 500 p.o. twice daily for 7 days  Repeat chest x-ray in about 4 weeks  If she does not continue to improve then a CT scan of the chest may be needed to further evaluate infiltrative process which appeared like a pneumonic process in December  Does not have any underlying pulmonary process, very rare smoker in the past     Virl Diamond MD [facility] 06/14/2020, 10:08 AM  CC: Janeece Agee, NP

## 2020-06-14 NOTE — Telephone Encounter (Signed)
I did review patient's x-ray  The fluid noted in the lung is better, not completely normal yet  I am calling in a course of antibiotics as we talked about to be used for 7 days- [employer]  I will see her in 4 weeks and repeat a chest x-ray at that time

## 2020-06-14 NOTE — Telephone Encounter (Signed)
Lm for patient.  

## 2020-06-14 NOTE — Patient Instructions (Signed)
We will get a chest x-ray today  We will update you with x-ray findings  I will call you antibiotics once I reviewed the x-ray  Call us in a couple of weeks to let us know how you are doing  If the x-ray is worsening, a CT scan of the chest will be appropriate  Pain medications as needed to control your symptoms  I will see you back in 4 to 6 weeks

## 2020-06-20 NOTE — Telephone Encounter (Signed)
Called and spoke with patient to see if she got our voicemail. She stated no. I let her know of results from CXR and to let her know that RX was sent into pharmacy from Dr. Wynona Neat and gave her instructions on that. She expressed understanding and stated that she would go and pick it up. Told her repeat CXR would be done at her follow up visit on 4/25. Nothing further needed at this time.

## 2020-06-21 ENCOUNTER — Telehealth: Payer: Self-pay | Admitting: Pulmonary Disease

## 2020-06-21 MED ORDER — CLARITHROMYCIN 500 MG PO TABS
500.0000 mg | ORAL_TABLET | Freq: Two times a day (BID) | ORAL | 0 refills | Status: DC
Start: 2020-06-21 — End: 2021-01-04

## 2020-06-21 NOTE — Telephone Encounter (Signed)
Called and spoke with patient. She stated that she needed to have the Biaxin sent to a different pharmacy. Biaxin has been sent to [facility] on [address]   Nothing further needed at time of call.

## 2020-06-27 ENCOUNTER — Encounter: Payer: Self-pay | Admitting: Internal Medicine

## 2020-06-27 ENCOUNTER — Other Ambulatory Visit: Payer: Self-pay

## 2020-06-27 ENCOUNTER — Ambulatory Visit: Payer: Self-pay | Attending: Internal Medicine | Admitting: Internal Medicine

## 2020-06-27 DIAGNOSIS — J302 Other seasonal allergic rhinitis: Secondary | ICD-10-CM

## 2020-06-27 DIAGNOSIS — R7303 Prediabetes: Secondary | ICD-10-CM

## 2020-06-27 DIAGNOSIS — E041 Nontoxic single thyroid nodule: Secondary | ICD-10-CM | POA: Insufficient documentation

## 2020-06-27 DIAGNOSIS — E782 Mixed hyperlipidemia: Secondary | ICD-10-CM

## 2020-06-27 DIAGNOSIS — R0781 Pleurodynia: Secondary | ICD-10-CM

## 2020-06-27 DIAGNOSIS — Z7689 Persons encountering health services in other specified circumstances: Secondary | ICD-10-CM

## 2020-06-27 NOTE — Progress Notes (Signed)
Virtual Visit via Telephone Note  I connected with Kathy Porter on 06/27/2020 at 12:51 PM by telephone and verified that I am speaking with the correct person using two identifiers.  Location: Patient: home Provider: office  Participants: Myself Patient  I discussed the limitations, risks, security and privacy concerns of performing an evaluation and management service by telephone and the availability of in person appointments. I also discussed with the patient that there may be a patient responsible charge related to this service. The patient expressed understanding and agreed to proceed.   History of Present Illness: Pt with hx of preDM, HL Pt wants to est care with [place].  Previous PCP was at [facility].  Pt seen in ER 03/03/2020 for left-sided pleuritic chest pain.  Chest x-ray revealed small left pleural effusion.  CT angio of the chest negative for PE but did reveal left lower lobe atelectasis and small left effusion.  Incidental finding of 15 mm nodule in the left lobe of the thyroid.   -Saw provider at [facility] 04/18/2020.  Repeat chest x-ray revealed mild interval enlargement of small left pleural effusion.  QuantiFERON gold was negative.  Noted to have elevated blood sugar on chemistry.  Referred to pulmonary.  Saw pulmonologist Dr. Wynona Neat 06/14/2020.  Repeat chest x-ray showed trace left pleural effusion which he states had decreased in size.  Patient prescribed Biaxin antibiotics and told to follow-up in 1 month for repeat imaging.  Today: Patient reports she has 2 days left of the antibiotics.  She is feeling better.  Pleuritic chest pain resolved since January.  Denies any cough or shortness of breath.  No fever.  C/o itching under the nostrils and in both ears x 1 mth.  Slight rash under the nose and dryness in ears. No flaking.  Using Neosporin cream without relief. -endorses mild sneezing.  No rhinorrhea, itchy water eyes, itchy throat  I note diagnosis of prediabetes and  hyperlipidemia on her chart.  Patient was not aware of diagnosis.  A1c back in 2020 was in range for prediabetes.  She does not exercise.  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  Feels she does okay with her eating habits.  Past medical, social, family history and surgical history reviewed and updated.  Social History   Socioeconomic History  . Marital status: Widowed    Spouse name: Not on file  . Number of children: 3  . Years of education: Not on file  . Highest education level: 11th grade  Occupational History  . Occupation: Hair braiding  Tobacco Use  . Smoking status: Never Smoker  . Smokeless tobacco: Never Used  Vaping Use  . Vaping Use: Never used  Substance and Sexual Activity  . Alcohol use: No  . Drug use: No  . Sexual activity: Never  Other Topics Concern  . Not on file  Social History Narrative  . Not on file   Social Determinants of Health   Financial Resource Strain: Not on file  Food Insecurity: Not on file  Transportation Needs: Not on file  Physical Activity: Not on file  Stress: Not on file  Social Connections: Not on file     Outpatient Encounter Medications as of 06/27/2020  Medication Sig  . clarithromycin (BIAXIN) 500 MG tablet Take 1 tablet (500 mg total) by mouth 2 (two) times daily.  . [DISCONTINUED] HYDROcodone-acetaminophen (NORCO/VICODIN) 5-325 MG tablet Take 1-2 tablets by mouth every 6 (six) hours as needed. (Patient  not taking: Reported on 06/14/2020)   No facility-administered encounter medications on file as of 06/27/2020.      Observations/Objective: No direct observation done as this is a telephone encounter. Results for orders placed or performed in visit on 04/18/20  CBC With Differential  Result Value Ref Range   WBC 5.8 3.4 - 10.8 x10E3/uL   RBC 4.72 3.77 - 5.28 x10E6/uL   Hemoglobin 13.4 11.1 - 15.9 g/dL   Hematocrit 27.0 62.3 - 46.6 %   MCV 82 79 - 97 fL   MCH 28.4 26.6 - 33.0 pg   MCHC  34.6 31.5 - 35.7 g/dL   RDW 76.2 83.1 - 51.7 %   Neutrophils 58 Not Estab. %   Lymphs 32 Not Estab. %   Monocytes 8 Not Estab. %   Eos 2 Not Estab. %   Basos 0 Not Estab. %   Neutrophils Absolute 3.3 1.4 - 7.0 x10E3/uL   Lymphocytes Absolute 1.8 0.7 - 3.1 x10E3/uL   Monocytes Absolute 0.5 0.1 - 0.9 x10E3/uL   EOS (ABSOLUTE) 0.1 0.0 - 0.4 x10E3/uL   Basophils Absolute 0.0 0.0 - 0.2 x10E3/uL   Immature Granulocytes 0 Not Estab. %   Immature Grans (Abs) 0.0 0.0 - 0.1 x10E3/uL  Basic Metabolic Panel  Result Value Ref Range   Glucose 129 (H) 65 - 99 mg/dL   BUN 12 6 - 24 mg/dL   Creatinine, Ser 6.16 0.57 - 1.00 mg/dL   GFR calc non Af Amer 65 >59 mL/min/1.73   GFR calc Af Amer 75 >59 mL/min/1.73   BUN/Creatinine Ratio 12 9 - 23   Sodium 140 134 - 144 mmol/L   Potassium 4.4 3.5 - 5.2 mmol/L   Chloride 101 96 - 106 mmol/L   CO2 25 20 - 29 mmol/L   Calcium 9.6 8.7 - 10.2 mg/dL  QuantiFERON-TB Gold Plus  Result Value Ref Range   QuantiFERON Incubation Incubation performed.    QuantiFERON Criteria Comment    QuantiFERON TB1 Ag Value 0.09 IU/mL   QuantiFERON TB2 Ag Value 0.21 IU/mL   QuantiFERON Nil Value 0.12 IU/mL   QuantiFERON Mitogen Value >10.00 IU/mL   QuantiFERON-TB Gold Plus Negative Negative     Assessment and Plan: 1. Establishing care with new doctor, encounter for  2. Seasonal allergic rhinitis, unspecified trigger Symptoms that she is having of the nose and ears sound as though it may be seasonal allergies.  I recommend over-the-counter Claritin.  3. Prediabetes Discussed importance of healthy eating habits and regular exercise to prevent progression to diabetes.  Advised to eliminate sugary drinks from the diet, cut back on portion sizes of white carbohydrates, eat more white lean meat instead of red meat and incorporate fresh fruits and vegetables into the diet daily.  Advised to get in some form of moderate intensity exercise at least 3 to 4 days a week for 30  minutes.  She is agreeable to coming to the lab to have A1c, lipid profile and liver function tests done prior to her in person visit with me - Hemoglobin A1c; Future  4. Mixed hyperlipidemia - Lipid panel; Future - Hepatic Function Panel; Future  5. Pleuritic chest pain This by her report has resolved.  She is feeling much better on antibiotics.  Advised to complete the full course of the antibiotics and keep follow-up appointment with pulmonary in 1 month for repeat imaging.  6.  Thyroid nodule Advised patient of this incidental finding that was seen on CTA of the chest.  Informed of need for ultrasound to evaluate this further.  Patient is agreeable to having this done.  Message sent to my CMA to schedule for her. Order baseline thyroid chemistry   Follow Up Instructions: 6-7 weeks for well woman exam/pap   I discussed the assessment and treatment plan with the patient. The patient was provided an opportunity to ask questions and all were answered. The patient agreed with the plan and demonstrated an understanding of the instructions.   The patient was advised to call back or seek an in-person evaluation if the symptoms worsen or if the condition fails to improve as anticipated.  I  Spent 24 minutes on this telephone encounter  Jonah Blue, MD

## 2020-06-29 ENCOUNTER — Telehealth: Payer: Self-pay

## 2020-06-29 NOTE — Telephone Encounter (Signed)
Contacted pt to go over appt info  US Thyroid is scheduled for 4/14 at 130pm at [facility]   Pt doesn't have any questions or concerns

## 2020-07-07 ENCOUNTER — Other Ambulatory Visit: Payer: Self-pay

## 2020-07-07 ENCOUNTER — Ambulatory Visit (HOSPITAL_COMMUNITY)
Admission: RE | Admit: 2020-07-07 | Discharge: 2020-07-07 | Disposition: A | Discharge status: Home / Self Care | Payer: No Typology Code available for payment source | Source: Ambulatory Visit | Attending: Internal Medicine | Admitting: Internal Medicine

## 2020-07-07 DIAGNOSIS — E041 Nontoxic single thyroid nodule: Secondary | ICD-10-CM | POA: Insufficient documentation

## 2020-07-08 NOTE — Progress Notes (Signed)
Let patient know that the thyroid ultrasound confirms the presence of a small nodule in the left thyroid gland.  Size and features do not warrant the need for biopsy at this time.  The radiologist recommends repeat ultrasound in 1 year.

## 2020-07-11 ENCOUNTER — Other Ambulatory Visit: Payer: No Typology Code available for payment source

## 2020-07-15 ENCOUNTER — Other Ambulatory Visit: Payer: Self-pay

## 2020-07-15 DIAGNOSIS — R079 Chest pain, unspecified: Secondary | ICD-10-CM

## 2020-07-18 ENCOUNTER — Ambulatory Visit: Payer: No Typology Code available for payment source

## 2020-07-18 ENCOUNTER — Ambulatory Visit: Payer: No Typology Code available for payment source | Admitting: Pulmonary Disease

## 2020-07-18 ENCOUNTER — Telehealth: Payer: Self-pay | Admitting: Internal Medicine

## 2020-07-18 NOTE — Telephone Encounter (Signed)
-----   Message from Marcine Matar, MD sent at 06/27/2020  9:17 AM EDT ----- F/u in 6-7 wks for well woman exam including pap

## 2020-07-18 NOTE — Telephone Encounter (Signed)
Called Pt no answer. Pt needs to be added to Dr Laural Benes schedule for a well woman exam including a pap. Left vm for Pt to call [phone] to schedule phy appt.

## 2021-01-04 ENCOUNTER — Encounter: Payer: Self-pay | Admitting: Internal Medicine

## 2021-01-04 ENCOUNTER — Other Ambulatory Visit: Payer: Self-pay

## 2021-01-04 ENCOUNTER — Ambulatory Visit: Payer: Self-pay | Admitting: Internal Medicine

## 2021-01-04 VITALS — BP 118/64 | HR 68 | Resp 16 | Ht 67.0 in | Wt 167.0 lb

## 2021-01-04 DIAGNOSIS — J90 Pleural effusion, not elsewhere classified: Secondary | ICD-10-CM

## 2021-01-04 DIAGNOSIS — E7800 Pure hypercholesterolemia, unspecified: Secondary | ICD-10-CM

## 2021-01-04 DIAGNOSIS — E041 Nontoxic single thyroid nodule: Secondary | ICD-10-CM

## 2021-01-04 DIAGNOSIS — R7303 Prediabetes: Secondary | ICD-10-CM

## 2021-01-04 NOTE — Progress Notes (Unsigned)
    Subjective:    Patient ID: Kathy Porter, female   DOB: [birth year], 50 y.o.   MRN: [id number]   HPI  Here to establish Originally from [place].  [place]ame to [place].     Pain in left lateral/posterior lung field.  See previous history dating from December of 2021.   Quantiferon testing negative 04/18/20.   Ultimately seen by pulmonology and treated with course of Biaxin.  Did feel better.  She was unable to follow up with specialist or xray as she could not afford.   She does feel much better than before antibiotic.  Still with some sharp discomfort with deep breathing, coughing, or sneezing.  No fever.  She does not cough frequently.  No dyspnea.   She is able to continue to do her daily living.   No history of blood transfusion.  Has never used IV drugs.  No partners have received blood transfusion or IV drug users as well.    Does state she was in [place] for 3 months, returning in September of 2021 prior to illness, which became symptomatic on December 24th.  She is wondering if she had COVID  as her niece who lived with her came back from [place] with her teenage friends and they all came down with COVID.  Pt developed chills, cough, loss of appetite, fatigue, myalgias with this illness before going into ED and being found to have a pleural effusion on the left She was vaccinated with Pfizer x 2, the last 07/12/19.    2.  Left superior thyroid nodule on ultrasound correlating to one seen on CT of chest, performed 07/07/20:  recommended follow up U/S in 1 year.  TSH in 2020 normal.    3.  Prediabetes:  A1C was checked in 06/2018 and found to be 5.9%.  Has not been checked since.  Her blood glucose when ill in January was 129.    No outpatient medications have been marked as taking for the 01/04/21 encounter (Office Visit) with Julieanne Manson, MD.   No Known Allergies   Review of Systems    Objective:   BP 118/64 (BP Location: Left Arm, Patient Position: Sitting,  Cuff Size: Normal)   Pulse 68   Resp 16   Ht 5\' 7"  (1.702 m)   Wt 167 lb (75.8 kg)   LMP  (LMP Unknown)   BMI 26.16 kg/m   Physical Exam Lungs:  CTA without good air movement Left thyroid nodule  Assessment & Plan   ***

## 2021-01-05 LAB — COMPREHENSIVE METABOLIC PANEL
ALT: 5 IU/L (ref 0–32)
AST: 14 IU/L (ref 0–40)
Albumin/Globulin Ratio: 1.4 (ref 1.2–2.2)
Albumin: 4.3 g/dL (ref 3.8–4.9)
Alkaline Phosphatase: 86 IU/L (ref 44–121)
BUN/Creatinine Ratio: 15 (ref 9–23)
BUN: 13 mg/dL (ref 6–24)
Bilirubin Total: 0.2 mg/dL (ref 0.0–1.2)
CO2: 19 mmol/L — ABNORMAL LOW (ref 20–29)
Calcium: 9.8 mg/dL (ref 8.7–10.2)
Chloride: 103 mmol/L (ref 96–106)
Creatinine, Ser: 0.85 mg/dL (ref 0.57–1.00)
Globulin, Total: 3 g/dL (ref 1.5–4.5)
Glucose: 90 mg/dL (ref 70–99)
Potassium: 4.4 mmol/L (ref 3.5–5.2)
Sodium: 141 mmol/L (ref 134–144)
Total Protein: 7.3 g/dL (ref 6.0–8.5)
eGFR: 82 mL/min/{1.73_m2} (ref >59)

## 2021-01-05 LAB — CBC WITH DIFFERENTIAL/PLATELET
Basophils Absolute: 0 10*3/uL (ref 0.0–0.2)
Basos: 1 %
EOS (ABSOLUTE): 0.1 10*3/uL (ref 0.0–0.4)
Eos: 2 %
Hematocrit: 38.9 % (ref 34.0–46.6)
Hemoglobin: 13.4 g/dL (ref 11.1–15.9)
Immature Grans (Abs): 0 10*3/uL (ref 0.0–0.1)
Immature Granulocytes: 1 %
Lymphocytes Absolute: 1.5 10*3/uL (ref 0.7–3.1)
Lymphs: 35 %
MCH: 28.7 pg (ref 26.6–33.0)
MCHC: 34.4 g/dL (ref 31.5–35.7)
MCV: 83 fL (ref 79–97)
Monocytes Absolute: 0.4 10*3/uL (ref 0.1–0.9)
Monocytes: 9 %
Neutrophils Absolute: 2.2 10*3/uL (ref 1.4–7.0)
Neutrophils: 52 %
Platelets: 366 10*3/uL (ref 150–450)
RBC: 4.67 x10E6/uL (ref 3.77–5.28)
RDW: 12.8 % (ref 11.7–15.4)
WBC: 4.2 10*3/uL (ref 3.4–10.8)

## 2021-01-05 LAB — TSH: TSH: 1.05 u[IU]/mL (ref 0.450–4.500)

## 2021-01-05 LAB — HEPATITIS C ANTIBODY: Hep C Virus Ab: <0.1 s/co ratio (ref 0.0–0.9)

## 2021-01-05 LAB — HIV ANTIBODY (ROUTINE TESTING W REFLEX): HIV Screen 4th Generation wRfx: NONREACTIVE

## 2021-01-05 LAB — LIPID PANEL W/O CHOL/HDL RATIO
Cholesterol, Total: 209 mg/dL — ABNORMAL HIGH (ref 100–199)
HDL: 54 mg/dL (ref >39)
LDL Chol Calc (NIH): 137 mg/dL — ABNORMAL HIGH (ref 0–99)
Triglycerides: 102 mg/dL (ref 0–149)
VLDL Cholesterol Cal: 18 mg/dL (ref 5–40)

## 2021-01-05 LAB — HGB A1C W/O EAG: Hgb A1c MFr Bld: 5.7 % — ABNORMAL HIGH (ref 4.8–5.6)

## 2021-04-06 ENCOUNTER — Ambulatory Visit: Payer: Self-pay | Attending: Internal Medicine | Admitting: Internal Medicine

## 2021-04-06 ENCOUNTER — Encounter: Payer: Self-pay | Admitting: Internal Medicine

## 2021-04-06 ENCOUNTER — Other Ambulatory Visit: Payer: Self-pay

## 2021-04-06 DIAGNOSIS — Z0120 Encounter for dental examination and cleaning without abnormal findings: Secondary | ICD-10-CM

## 2021-04-06 DIAGNOSIS — Z1231 Encounter for screening mammogram for malignant neoplasm of breast: Secondary | ICD-10-CM

## 2021-04-06 DIAGNOSIS — R0781 Pleurodynia: Secondary | ICD-10-CM

## 2021-04-06 DIAGNOSIS — Z23 Encounter for immunization: Secondary | ICD-10-CM

## 2021-04-06 NOTE — Progress Notes (Signed)
Virtual Visit via Telephone Note  I connected with Kathy Porter DOB Apr 13, 1970 on 04/06/2021 at 9:18 AM by telephone and verified that I am speaking with the correct person using two identifiers  Location: Patient: home Provider: office  Participants: Myself Patient   I discussed the limitations, risks, security and privacy concerns of performing an evaluation and management service by telephone and the availability of in person appointments. I also discussed with the patient that there may be a patient responsible charge related to this service. The patient expressed understanding and agreed to proceed.   History of Present Illness: Patient with history of prediabetes, HL, left thyroid nodule, left pleural effusion 02/2020 that subsequently improved on imaging. Last saw me 06/2020.  She was supposed to return to have Pap smear done/well woman exam but she did not due to lack of insurance.  She subsequently saw Dr. Amil Amen October of last year complaining of pain in the left lateral/posterior lung field dating back to December 2021.  She had ordered a CAT scan of the chest which patient did not get done.  Today:  No chronic cough or SOB.  However when she takes a deep breath she feels a pinching on mid portion of LT chest anteriorly and posteriorly. This is chronic and present since having LT sided pleural effusion in 2021.  unchanged in severity from before.  No recent travel.  No fevers or weight changes.  She had seen Dr. Ander Slade in the spring for f/u hosp for LT sided pleural effusion.  Repeat chest x-ray showed left-sided pleural effusion was resolving.  He had given her some biaxin at that time and plan was for her to follow-up in 1 month with repeat chest x-ray.  Patient did not follow-up.  QuantiFERON gold test was negative. Has had 2 Pfizer COVID-19 vaccines.  She has not had flu shot.  She now has OC and has Friday insurance and wonders whether she needs to move forward with getting  the CAT scan done..   Wants referral to dentist for routine cleaning.   MEDS: Pt currently not on any meds   Observations/Objective: No direct observation done as this was a telephone encounter. Results for orders placed or performed in visit on 01/04/21  Hgb A1c w/o eAG  Result Value Ref Range   Hgb A1c MFr Bld 5.7 (H) 4.8 - 5.6 %  HIV Antibody (routine testing w rflx)  Result Value Ref Range   HIV Screen 4th Generation wRfx Non Reactive Non Reactive  Hepatitis C antibody  Result Value Ref Range   Hep C Virus Ab <0.1 0.0 - 0.9 s/co ratio  TSH  Result Value Ref Range   TSH 1.050 0.450 - 4.500 uIU/mL  Comprehensive metabolic panel  Result Value Ref Range   Glucose 90 70 - 99 mg/dL   BUN 13 6 - 24 mg/dL   Creatinine, Ser 0.85 0.57 - 1.00 mg/dL   eGFR 82 >59 mL/min/1.73   BUN/Creatinine Ratio 15 9 - 23   Sodium 141 134 - 144 mmol/L   Potassium 4.4 3.5 - 5.2 mmol/L   Chloride 103 96 - 106 mmol/L   CO2 19 (L) 20 - 29 mmol/L   Calcium 9.8 8.7 - 10.2 mg/dL   Total Protein 7.3 6.0 - 8.5 g/dL   Albumin 4.3 3.8 - 4.9 g/dL   Globulin, Total 3.0 1.5 - 4.5 g/dL   Albumin/Globulin Ratio 1.4 1.2 - 2.2   Bilirubin Total 0.2 0.0 - 1.2 mg/dL   Alkaline  Phosphatase 86 44 - 121 IU/L   AST 14 0 - 40 IU/L   ALT 5 0 - 32 IU/L  CBC with Differential/Platelet  Result Value Ref Range   WBC 4.2 3.4 - 10.8 x10E3/uL   RBC 4.67 3.77 - 5.28 x10E6/uL   Hemoglobin 13.4 11.1 - 15.9 g/dL   Hematocrit 38.9 34.0 - 46.6 %   MCV 83 79 - 97 fL   MCH 28.7 26.6 - 33.0 pg   MCHC 34.4 31.5 - 35.7 g/dL   RDW 12.8 11.7 - 15.4 %   Platelets 366 150 - 450 x10E3/uL   Neutrophils 52 Not Estab. %   Lymphs 35 Not Estab. %   Monocytes 9 Not Estab. %   Eos 2 Not Estab. %   Basos 1 Not Estab. %   Neutrophils Absolute 2.2 1.4 - 7.0 x10E3/uL   Lymphocytes Absolute 1.5 0.7 - 3.1 x10E3/uL   Monocytes Absolute 0.4 0.1 - 0.9 x10E3/uL   EOS (ABSOLUTE) 0.1 0.0 - 0.4 x10E3/uL   Basophils Absolute 0.0 0.0 - 0.2  x10E3/uL   Immature Granulocytes 1 Not Estab. %   Immature Grans (Abs) 0.0 0.0 - 0.1 x10E3/uL  Lipid Panel w/o Chol/HDL Ratio  Result Value Ref Range   Cholesterol, Total 209 (H) 100 - 199 mg/dL   Triglycerides 102 0 - 149 mg/dL   HDL 54 >39 mg/dL   VLDL Cholesterol Cal 18 5 - 40 mg/dL   LDL Chol Calc (NIH) 137 (H) 0 - 99 mg/dL     Assessment and Plan: 1. Pleuritic chest pain We discussed getting a chest x-ray versus having her wait until she sees me for me to listen to her chest and then decide on imaging study.  Patient prefers the latter.  I think this is reasonable since there has been no progression in her symptoms. -She needs the flu vaccine.  I have given her 3 dates of Saturdays where our mobile bus will be giving out free flu shots to the community.  She plans to try to make it this Saturday.  2. Encounter for routine dental examination - Ambulatory referral to Dentistry  3. Encounter for screening mammogram for malignant neoplasm of breast - MM Digital Screening; Future  4. Need for shingles vaccine We will plan to give on her upcoming visit.   Follow Up Instructions: 1 mth for physical/pap. Message sent to scheduling   I discussed the assessment and treatment plan with the patient. The patient was provided an opportunity to ask questions and all were answered. The patient agreed with the plan and demonstrated an understanding of the instructions.   The patient was advised to call back or seek an in-person evaluation if the symptoms worsen or if the condition fails to improve as anticipated.  I  Spent 14 minutes on this telephone encounter  Karle Plumber, MD

## 2021-04-10 ENCOUNTER — Telehealth: Payer: Self-pay

## 2021-04-10 NOTE — Telephone Encounter (Signed)
-----   Message from Ladell Pier, MD sent at 04/06/2021  9:39 AM EST ----- Needs in person appointment with me in 1 month for physical/Pap smear.

## 2021-04-10 NOTE — Telephone Encounter (Signed)
Pt has been scheduled and reminder has been mailed.  

## 2021-05-16 ENCOUNTER — Other Ambulatory Visit: Payer: Self-pay

## 2021-05-16 ENCOUNTER — Encounter: Payer: Self-pay | Admitting: Internal Medicine

## 2021-05-16 ENCOUNTER — Ambulatory Visit: Payer: Self-pay | Attending: Internal Medicine | Admitting: Internal Medicine

## 2021-05-16 ENCOUNTER — Other Ambulatory Visit (HOSPITAL_COMMUNITY)
Admission: RE | Admit: 2021-05-16 | Discharge: 2021-05-16 | Disposition: A | Discharge status: Home / Self Care | Payer: Self-pay | Source: Ambulatory Visit | Attending: Internal Medicine | Admitting: Internal Medicine

## 2021-05-16 VITALS — BP 113/76 | HR 78 | Resp 16 | Wt 174.4 lb

## 2021-05-16 DIAGNOSIS — L918 Other hypertrophic disorders of the skin: Secondary | ICD-10-CM

## 2021-05-16 DIAGNOSIS — Z0001 Encounter for general adult medical examination with abnormal findings: Secondary | ICD-10-CM

## 2021-05-16 DIAGNOSIS — Z23 Encounter for immunization: Secondary | ICD-10-CM

## 2021-05-16 DIAGNOSIS — Z1211 Encounter for screening for malignant neoplasm of colon: Secondary | ICD-10-CM

## 2021-05-16 DIAGNOSIS — Z124 Encounter for screening for malignant neoplasm of cervix: Secondary | ICD-10-CM

## 2021-05-16 DIAGNOSIS — Z2821 Immunization not carried out because of patient refusal: Secondary | ICD-10-CM

## 2021-05-16 DIAGNOSIS — E663 Overweight: Secondary | ICD-10-CM

## 2021-05-16 DIAGNOSIS — Z0000 Encounter for general adult medical examination without abnormal findings: Secondary | ICD-10-CM

## 2021-05-16 DIAGNOSIS — E782 Mixed hyperlipidemia: Secondary | ICD-10-CM

## 2021-05-16 DIAGNOSIS — R0781 Pleurodynia: Secondary | ICD-10-CM

## 2021-05-16 DIAGNOSIS — Z6827 Body mass index (BMI) 27.0-27.9, adult: Secondary | ICD-10-CM

## 2021-05-16 DIAGNOSIS — R059 Cough, unspecified: Secondary | ICD-10-CM

## 2021-05-16 DIAGNOSIS — R7303 Prediabetes: Secondary | ICD-10-CM

## 2021-05-16 NOTE — Patient Instructions (Signed)
Preventive Care 40-53 Years Old, Female °Preventive care refers to lifestyle choices and visits with your health care provider that can promote health and wellness. Preventive care visits are also called wellness exams. °What can I expect for my preventive care visit? °Counseling °Your health care provider may ask you questions about your: °Medical history, including: °Past medical problems. °Family medical history. °Pregnancy history. °Current health, including: °Menstrual cycle. °Method of birth control. °Emotional well-being. °Home life and relationship well-being. °Sexual activity and sexual health. °Lifestyle, including: °Alcohol, nicotine or tobacco, and drug use. °Access to firearms. °Diet, exercise, and sleep habits. °Work and work environment. °Sunscreen use. °Safety issues such as seatbelt and bike helmet use. °Physical exam °Your health care provider will check your: °Height and weight. These may be used to calculate your BMI (body mass index). BMI is a measurement that tells if you are at a healthy weight. °Waist circumference. This measures the distance around your waistline. This measurement also tells if you are at a healthy weight and may help predict your risk of certain diseases, such as type 2 diabetes and high blood pressure. °Heart rate and blood pressure. °Body temperature. °Skin for abnormal spots. °What immunizations do I need? °Vaccines are usually given at various ages, according to a schedule. Your health care provider will recommend vaccines for you based on your age, medical history, and lifestyle or other factors, such as travel or where you work. °What tests do I need? °Screening °Your health care provider may recommend screening tests for certain conditions. This may include: °Lipid and cholesterol levels. °Diabetes screening. This is done by checking your blood sugar (glucose) after you have not eaten for a while (fasting). °Pelvic exam and Pap test. °Hepatitis B test. °Hepatitis C  test. °HIV (human immunodeficiency virus) test. °STI (sexually transmitted infection) testing, if you are at risk. °Lung cancer screening. °Colorectal cancer screening. °Mammogram. Talk with your health care provider about when you should start having regular mammograms. This may depend on whether you have a family history of breast cancer. °BRCA-related cancer screening. This may be done if you have a family history of breast, ovarian, tubal, or peritoneal cancers. °Bone density scan. This is done to screen for osteoporosis. °Talk with your health care provider about your test results, treatment options, and if necessary, the need for more tests. °Follow these instructions at home: °Eating and drinking ° °Eat a diet that includes fresh fruits and vegetables, whole grains, lean protein, and low-fat dairy products. °Take vitamin and mineral supplements as recommended by your health care provider. °Do not drink alcohol if: °Your health care provider tells you not to drink. °You are pregnant, may be pregnant, or are planning to become pregnant. °If you drink alcohol: °Limit how much you have to 0-1 drink a day. °Know how much alcohol is in your drink. In the [place] one drink equals one 12 oz bottle of beer (355 mL), one 5 oz glass of wine (148 mL), or one 1½ oz glass of hard liquor (44 mL). °Lifestyle °Brush your teeth every morning and night with fluoride toothpaste. Floss one time each day. °Exercise for at least 30 minutes 5 or more days each week. °Do not use any products that contain nicotine or tobacco. These products include cigarettes, chewing tobacco, and vaping devices, such as e-cigarettes. If you need help quitting, ask your health care provider. °Do not use drugs. °If you are sexually active, practice safe sex. Use a condom or other form of protection to prevent   STIs. If you do not wish to become pregnant, use a form of birth control. If you plan to become pregnant, see your health care provider for a  prepregnancy visit. Take aspirin only as told by your health care provider. Make sure that you understand how much to take and what form to take. Work with your health care provider to find out whether it is safe and beneficial for you to take aspirin daily. Find healthy ways to manage stress, such as: Meditation, yoga, or listening to music. Journaling. Talking to a trusted person. Spending time with friends and family. Minimize exposure to UV radiation to reduce your risk of skin cancer. Safety Always wear your seat belt while driving or riding in a vehicle. Do not drive: If you have been drinking alcohol. Do not ride with someone who has been drinking. When you are tired or distracted. While texting. If you have been using any mind-altering substances or drugs. Wear a helmet and other protective equipment during sports activities. If you have firearms in your house, make sure you follow all gun safety procedures. Seek help if you have been physically or sexually abused. What's next? Visit your health care provider once a year for an annual wellness visit. Ask your health care provider how often you should have your eyes and teeth checked. Stay up to date on all vaccines. This information is not intended to replace advice given to you by your health care provider. Make sure you discuss any questions you have with your health care provider. Document Revised: 09/07/2020 Document Reviewed: 09/07/2020 Elsevier Patient Education  2022 [place] Following a healthy eating pattern may help you to achieve and maintain a healthy body weight, reduce the risk of chronic disease, and live a long and productive life. It is important to follow a healthy eating pattern at an appropriate calorie level for your body. Your nutritional needs should be met primarily through food by choosing a variety of nutrient-rich foods. What are tips for following this plan? Reading food  labels Read labels and choose the following: Reduced or low sodium. Juices with 100% fruit juice. Foods with low saturated fats and high polyunsaturated and monounsaturated fats. Foods with whole grains, such as whole wheat, cracked wheat, brown rice, and wild rice. Whole grains that are fortified with folic acid. This is recommended for women who are pregnant or who want to become pregnant. Read labels and avoid the following: Foods with a lot of added sugars. These include foods that contain brown sugar, corn sweetener, corn syrup, dextrose, fructose, glucose, high-fructose corn syrup, honey, invert sugar, lactose, malt syrup, maltose, molasses, raw sugar, sucrose, trehalose, or turbinado sugar. Do not eat more than the following amounts of added sugar per day: 6 teaspoons (25 g) for women. 9 teaspoons (38 g) for men. Foods that contain processed or refined starches and grains. Refined grain products, such as white flour, degermed cornmeal, white bread, and white rice. Shopping Choose nutrient-rich snacks, such as vegetables, whole fruits, and nuts. Avoid high-calorie and high-sugar snacks, such as potato chips, fruit snacks, and candy. Use oil-based dressings and spreads on foods instead of solid fats such as butter, stick margarine, or cream cheese. Limit pre-made sauces, mixes, and "instant" products such as flavored rice, instant noodles, and ready-made pasta. Try more plant-protein sources, such as tofu, tempeh, black beans, edamame, lentils, nuts, and seeds. Explore eating plans such as the Mediterranean diet or vegetarian diet. Cooking Use oil to saut or  stir-fry foods instead of solid fats such as butter, stick margarine, or lard. Try baking, boiling, grilling, or broiling instead of frying. Remove the fatty part of meats before cooking. Steam vegetables in water or broth. Meal planning  At meals, imagine dividing your plate into fourths: One-half of your plate is fruits and  vegetables. One-fourth of your plate is whole grains. One-fourth of your plate is protein, especially lean meats, poultry, eggs, tofu, beans, or nuts. Include low-fat dairy as part of your daily diet. Lifestyle Choose healthy options in all settings, including home, work, school, restaurants, or stores. Prepare your food safely: Wash your hands after handling raw meats. Keep food preparation surfaces clean by regularly washing with hot, soapy water. Keep raw meats separate from ready-to-eat foods, such as fruits and vegetables. Cook seafood, meat, poultry, and eggs to the recommended internal temperature. Store foods at safe temperatures. In general: Keep cold foods at 52F (4.4C) or below. Keep hot foods at 152F (60C) or above. Keep your freezer at [employer] (-17.8C) or below. Foods are no longer safe to eat when they have been between the temperatures of 40-152F (4.4-60C) for more than 2 hours. What foods should I eat? Fruits Aim to eat 2 cup-equivalents of fresh, canned (in natural juice), or frozen fruits each day. Examples of 1 cup-equivalent of fruit include 1 small apple, 8 large strawberries, 1 cup canned fruit,  cup dried fruit, or 1 cup 100% juice. Vegetables Aim to eat 2-3 cup-equivalents of fresh and frozen vegetables each day, including different varieties and colors. Examples of 1 cup-equivalent of vegetables include 2 medium carrots, 2 cups raw, leafy greens, 1 cup chopped vegetable (raw or cooked), or 1 medium baked potato. Grains Aim to eat 6 ounce-equivalents of whole grains each day. Examples of 1 ounce-equivalent of grains include 1 slice of bread, 1 cup ready-to-eat cereal, 3 cups popcorn, or  cup cooked rice, pasta, or cereal. Meats and other proteins Aim to eat 5-6 ounce-equivalents of protein each day. Examples of 1 ounce-equivalent of protein include 1 egg, 1/2 cup nuts or seeds, or 1 tablespoon (16 g) peanut butter. A cut of meat or fish that is the size of a  deck of cards is about 3-4 ounce-equivalents. Of the protein you eat each week, try to have at least 8 ounces come from seafood. This includes salmon, trout, herring, and anchovies. Dairy Aim to eat 3 cup-equivalents of fat-free or low-fat dairy each day. Examples of 1 cup-equivalent of dairy include 1 cup (240 mL) milk, 8 ounces (250 g) yogurt, 1 ounces (44 g) natural cheese, or 1 cup (240 mL) fortified soy milk. Fats and oils Aim for about 5 teaspoons (21 g) per day. Choose monounsaturated fats, such as canola and olive oils, avocados, peanut butter, and most nuts, or polyunsaturated fats, such as sunflower, corn, and soybean oils, walnuts, pine nuts, sesame seeds, sunflower seeds, and flaxseed. Beverages Aim for six 8-oz glasses of water per day. Limit coffee to three to five 8-oz cups per day. Limit caffeinated beverages that have added calories, such as soda and energy drinks. Limit alcohol intake to no more than 1 drink a day for nonpregnant women and 2 drinks a day for men. One drink equals 12 oz of beer (355 mL), 5 oz of wine (148 mL), or 1 oz of hard liquor (44 mL). Seasoning and other foods Avoid adding excess amounts of salt to your foods. Try flavoring foods with herbs and spices instead of salt. Avoid adding  sugar to foods. Try using oil-based dressings, sauces, and spreads instead of solid fats. This information is based on general [place] nutrition guidelines. For more information, visit [employer]. Exact amounts may vary based on your nutrition needs. Summary A healthy eating plan may help you to maintain a healthy weight, reduce the risk of chronic diseases, and stay active throughout your life. Plan your meals. Make sure you eat the right portions of a variety of nutrient-rich foods. Try baking, boiling, grilling, or broiling instead of frying. Choose healthy options in all settings, including home, work, school, restaurants, or stores. This information is not intended to  replace advice given to you by your health care provider. Make sure you discuss any questions you have with your health care provider. Document Revised: 11/08/2020 Document Reviewed: 11/08/2020 Elsevier Patient Education  [place].

## 2021-05-16 NOTE — Progress Notes (Signed)
Patient ID: Kathy Porter, female    DOB: 1970/08/05  MRN: [id number]  CC: Annual Exam   Subjective: Kathy Porter is a 51 y.o. female who presents for annual exam Her concerns today include:  Patient with history of prediabetes, HL, left thyroid nodule, left pleural effusion 02/2020 that subsequently improved on imaging   GYN History:  Pt is G3P3 Any hx of abn paps?:no Menses regular or irregular?: stopped at age 74 How long does menses last?  Menstrual flow light or heavy?:  Method of birth control?:   Any vaginal dischg at this time?:  no Dysuria?: no Any hx of STI?: no Sexually active with how many partners:  not currently.  Last about 1 yr ago Desires STI screen: had done within the yr and it was negative Last MMG: scheduled 05/29/2021 Family hx of uterine, cervical or breast cancer?:  no  Overwgh/PreDM/HL: Patient reports she had lost a lot of weight when she was sick and hospitalized with pleural effusion back in 2021.  States that she is just beginning to regain her weight.  She is not exercising.   -LDL cholesterol was 137 with goal being less than 100.  HM: Declines shingles and flu shot. Agrees Tdapt.  Due for colon cancer screening. Patient Active Problem List   Diagnosis Date Noted   Thyroid nodule 06/27/2020   Pre-diabetes 06/27/2018   Pure hypercholesterolemia 06/27/2018     No current outpatient medications on file prior to visit.   No current facility-administered medications on file prior to visit.    No Known Allergies  Social History   Socioeconomic History   Marital status: Widowed    Spouse name: Not on file   Number of children: 3   Years of education: Not on file   Highest education level: 11th grade  Occupational History   Occupation: Hair braiding  Tobacco Use   Smoking status: Never   Smokeless tobacco: Never  Vaping Use   Vaping Use: Never used  Substance and Sexual Activity   Alcohol use: No   Drug use: No   Sexual  activity: Never  Other Topics Concern   Not on file  Social History Narrative   Not on file   Social Determinants of Health   Financial Resource Strain: Not on file  Food Insecurity: Not on file  Transportation Needs: Not on file  Physical Activity: Not on file  Stress: Not on file  Social Connections: Not on file  Intimate Partner Violence: Not on file    Family History  Problem Relation Age of Onset   Hypertension Mother    Stroke Father     No past surgical history on file.  ROS: Review of Systems  HENT:  Negative for hearing loss and sore throat.   Eyes:  Negative for visual disturbance.  Respiratory:         Reports occasional cough.  Gets itchy throat at times that causes cough.  No shortness of breath.  Still gets sharp pains around the left lateral and posterior chest when she takes deep breaths.  This has been going on ever since she had pleural effusion. No recent travel.  No fevers or weight loss.  She had seen Dr. Wynona Neat in the spring for f/u hosp for LT sided pleural effusion.  Repeat chest x-ray showed left-sided pleural effusion was resolving.  He had given her some biaxin at that time and plan was for her to follow-up in 1 month with repeat chest x-ray.  Patient did not follow-up.  QuantiFERON gold test was negative. Has had 2 Pfizer COVID-19 vaccines.  She has not had flu shot.   Cardiovascular:  Negative for palpitations and leg swelling.  Gastrointestinal:  Negative for blood in stool.  Genitourinary:  Negative for difficulty urinating.  Skin:  Negative for rash.       Has a tag on RT cheek which she has had for some time.  She sometimes tries to clip the edges of it but it grows back.    PHYSICAL EXAM: BP 113/76    Pulse 78    Resp 16    Wt 174 lb 6.4 oz (79.1 kg)    LMP  (LMP Unknown)    SpO2 99%    BMI 27.31 kg/m   Wt Readings from Last 3 Encounters:  05/16/21 174 lb 6.4 oz (79.1 kg)  01/04/21 167 lb (75.8 kg)  06/14/20 174 lb 6.4 oz (79.1 kg)     Physical Exam  General appearance - alert, well appearing, and in no distress Mental status - normal mood, behavior, speech, dress, motor activity, and thought processes Eyes - pupils equal and reactive, extraocular eye movements intact Ears - bilateral TM's and external ear canals normal Nose - normal and patent, no erythema, discharge or polyps Mouth - mucous membranes moist, pharynx normal without lesions Neck - supple, no significant adenopathy Lymphatics - no palpable lymphadenopathy, no hepatosplenomegaly Chest - clear to auscultation, no wheezes, rales or rhonchi, symmetric air entry Heart - normal rate, regular rhythm, normal S1, S2, no murmurs, rubs, clicks or gallops Abdomen - soft, nontender, nondistended, no masses or organomegaly Breasts - breasts appear normal, no suspicious masses, no skin or nipple changes or axillary nodes Pelvic - normal external genitalia, vulva, vagina, cervix, uterus and adnexa Neurological - cranial nerves II through XII intact, motor and sensory grossly normal bilaterally Musculoskeletal - no joint tenderness, deformity or swelling Extremities - peripheral pulses normal, no pedal edema, no clubbing or cyanosis Skin -small horn lesion on the right cheek   CMP Latest Ref Rng & Units 01/04/2021 04/18/2020 03/03/2020  Glucose 70 - 99 mg/dL 90 129(H) 112(H)  BUN 6 - 24 mg/dL 13 12 11   Creatinine 0.57 - 1.00 mg/dL 0.85 1.00 0.94  Sodium 134 - 144 mmol/L 141 140 138  Potassium 3.5 - 5.2 mmol/L 4.4 4.4 3.7  Chloride 96 - 106 mmol/L 103 101 104  CO2 20 - 29 mmol/L 19(L) 25 25  Calcium 8.7 - 10.2 mg/dL 9.8 9.6 9.4  Total Protein 6.0 - 8.5 g/dL 7.3 - -  Total Bilirubin 0.0 - 1.2 mg/dL 0.2 - -  Alkaline Phos 44 - 121 IU/L 86 - -  AST 0 - 40 IU/L 14 - -  ALT 0 - 32 IU/L 5 - -   Lipid Panel     Component Value Date/Time   CHOL 209 (H) 01/04/2021 1311   TRIG 102 01/04/2021 1311   HDL 54 01/04/2021 1311   CHOLHDL 3.9 06/26/2018 1101   LDLCALC 137  (H) 01/04/2021 1311    CBC    Component Value Date/Time   WBC 4.2 01/04/2021 1311   WBC 6.6 03/03/2020 1003   RBC 4.67 01/04/2021 1311   RBC 4.52 03/03/2020 1003   HGB 13.4 01/04/2021 1311   HCT 38.9 01/04/2021 1311   PLT 366 01/04/2021 1311   MCV 83 01/04/2021 1311   MCH 28.7 01/04/2021 1311   MCH 29.0 03/03/2020 1003   MCHC 34.4 01/04/2021 1311  MCHC 33.4 03/03/2020 1003   RDW 12.8 01/04/2021 1311   LYMPHSABS 1.5 01/04/2021 1311   EOSABS 0.1 01/04/2021 1311   BASOSABS 0.0 01/04/2021 1311    ASSESSMENT AND PLAN:  1. Annual physical exam Encourage patient to get flu shot and shingles vaccine but she declined. Printed information given on preventative care for females ages 61 through 62.  2. Pleuritic chest pain Chronic and unchanged.  May be due to scarring from previous infection/effusion.  Doubt PE.  Will check chest x-ray to see if effusion has completely resolved as was planned by pulmonary. - DG Chest 2 View; Future  3. Overweight (BMI 25.0-29.9) 4. Prediabetes Patient advised to eliminate sugary drinks from the diet, cut back on portion sizes especially of white carbohydrates, eat more white lean meat like chicken [place] and seafood instead of beef or pork and incorporate fresh fruits and vegetables into the diet daily. Encouraged her to get in some form of moderate intensity exercise 3 to 5 days a week for 30 minutes.  5. Mixed hyperlipidemia See #3 above.  6. Skin tag - Ambulatory referral to Dermatology  7. Cough, unspecified type Partially due to allergies.  I recommend trying Claritin over-the-counter.  8. Pap smear for cervical cancer screening - Cytology - PAP  9. Screening for colon cancer Discussed colon cancer screening methods.  Patient prefers to do fit test. - Fecal occult blood, imunochemical([facility])  10. Need for Tdap vaccination Tdap given today.  11. Influenza vaccination declined  Patient was given the opportunity to ask  questions.  Patient verbalized understanding of the plan and was able to repeat key elements of the plan.   No orders of the defined types were placed in this encounter.    Requested Prescriptions    No prescriptions requested or ordered in this encounter    No follow-ups on file.  Karle Plumber, MD, FACP

## 2021-05-22 LAB — CYTOLOGY - PAP
Comment: NEGATIVE
Comment: NEGATIVE
HPV 16: NEGATIVE
HPV 18 / 45: NEGATIVE
High risk HPV: POSITIVE — AB

## 2021-05-23 ENCOUNTER — Telehealth: Payer: Self-pay | Admitting: Internal Medicine

## 2021-05-23 NOTE — Telephone Encounter (Signed)
Phone call placed to patient this afternoon to go over the results of her Pap smear.  I left a message informing her of who I am and requesting that she give [place] a call.  I will try her again tomorrow.

## 2021-05-24 ENCOUNTER — Telehealth: Payer: Self-pay | Admitting: Internal Medicine

## 2021-05-24 NOTE — Telephone Encounter (Signed)
Phone call placed to patient again this morning to go over the results of her Pap smear.  Got an automated answering for her voicemail.  I did not leave a message.  I will send patient a lab letter. ?

## 2021-05-24 NOTE — Telephone Encounter (Signed)
Letter has been mailed.

## 2021-05-29 ENCOUNTER — Ambulatory Visit: Payer: Self-pay

## 2021-06-12 ENCOUNTER — Ambulatory Visit: Payer: Self-pay

## 2021-07-19 ENCOUNTER — Encounter: Payer: Self-pay | Admitting: Internal Medicine

## 2021-07-19 NOTE — Progress Notes (Signed)
I received  biopsy results that was done by [facility] dermatology on 07/05/2021.  Biopsy taken from lesion on the face.  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 ?

## 2021-12-19 IMAGING — CR DG CHEST 2V
2 series · 2 of 2 positions shown · non-contrast
Comparison: 03/02/2020.

CLINICAL DATA: Chest pain.

EXAM:
CHEST - 2 VIEW

[chest pa]
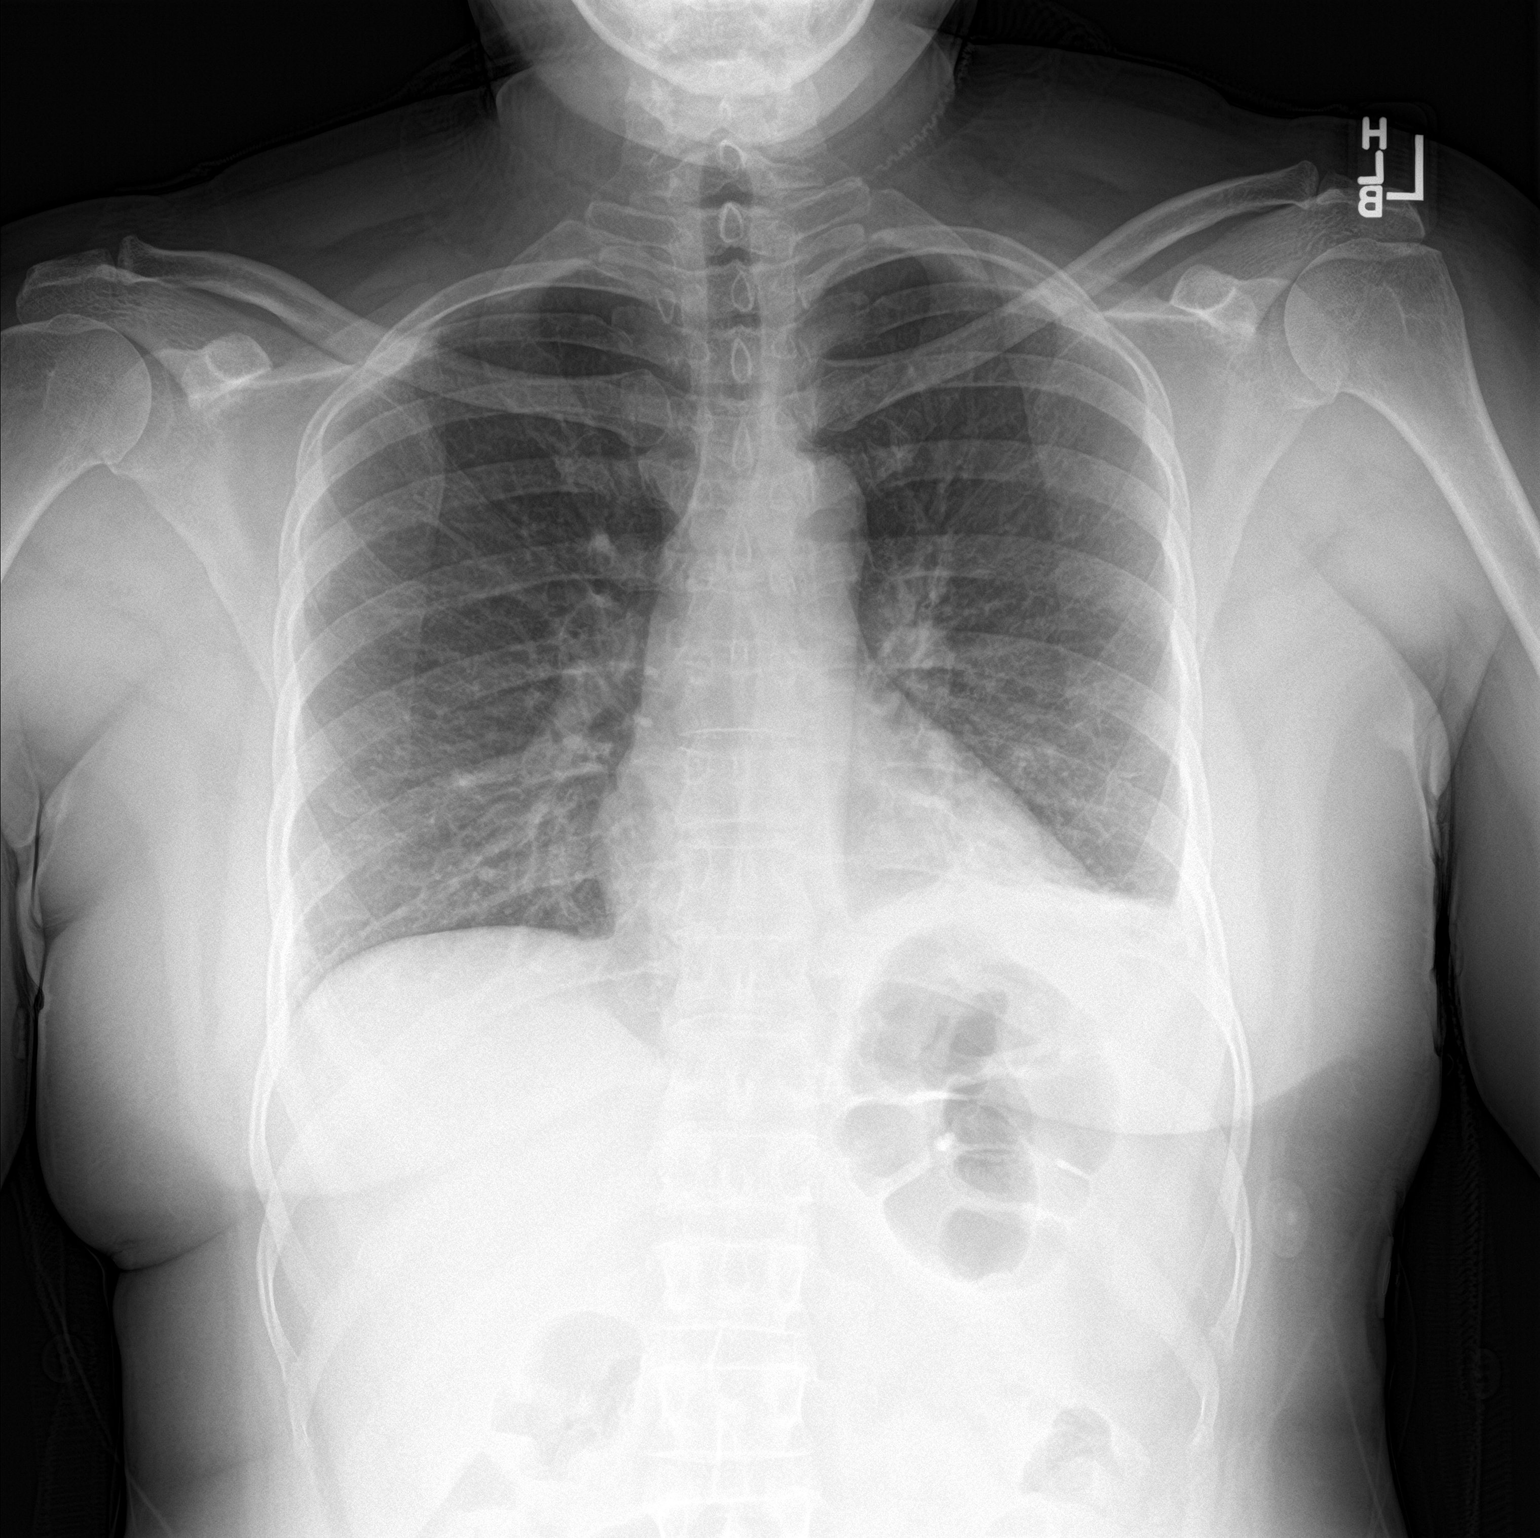

[chest lat]
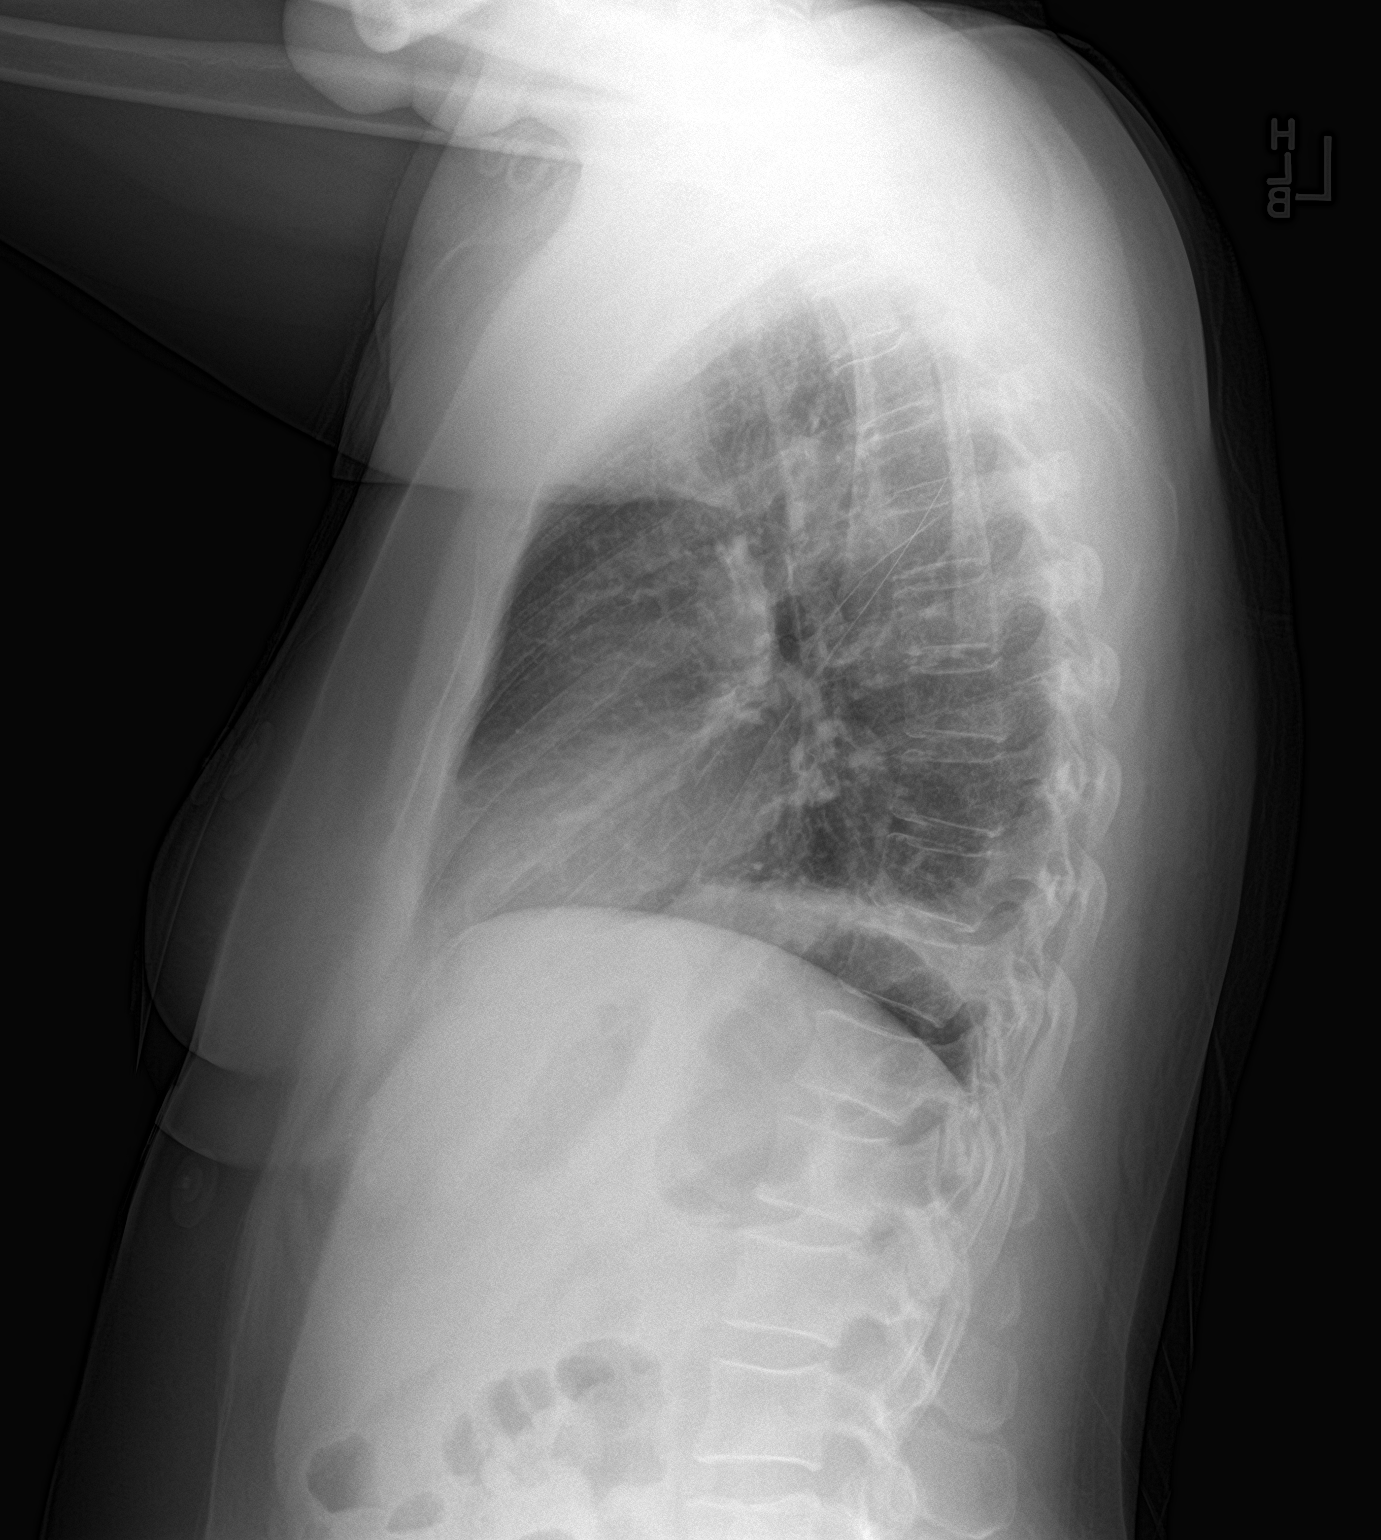

[2 of 2 positions shown; findings below may reference images not displayed]

FINDINGS: Mediastinum is normal. Mild left hilar prominence again noted. Heart
size normal. Low lung volumes. Left base infiltrate and small left
pleural effusion. No pneumothorax.
IMPRESSION: 1. As noted on prior study mild left hilar prominence is present.
Contrast-enhanced chest CT can be obtained to further evaluate.

2. Low lung volumes. Left base infiltrate consistent pneumonia.
Small left pleural effusion.
